# Patient Record
Sex: Male | Born: 1960 | Race: White | Hispanic: No | Marital: Married | Smoking: Former smoker
Health system: Southern US, Community
[De-identification: ages and names within clinical notes are randomized; demographics above are authoritative.]

## PROBLEM LIST (undated history)

## (undated) DIAGNOSIS — N200 Calculus of kidney: Secondary | ICD-10-CM

## (undated) HISTORY — DX: Calculus of kidney: N20.0

## (undated) HISTORY — PX: FOOT FRACTURE SURGERY: SHX645

## (undated) HISTORY — PX: HAND SURGERY: SHX662

---

## 2012-10-18 ENCOUNTER — Encounter: Payer: Self-pay | Admitting: Family Medicine

## 2012-10-18 ENCOUNTER — Telehealth: Payer: Self-pay | Admitting: Nurse Practitioner

## 2012-10-18 ENCOUNTER — Ambulatory Visit: Payer: Self-pay | Admitting: Family Medicine

## 2012-10-18 VITALS — BP 130/72 | HR 93 | Temp 98.1°F | Ht 68.5 in | Wt 214.8 lb

## 2012-10-18 DIAGNOSIS — J209 Acute bronchitis, unspecified: Secondary | ICD-10-CM

## 2012-10-18 DIAGNOSIS — R05 Cough: Secondary | ICD-10-CM

## 2012-10-18 DIAGNOSIS — J309 Allergic rhinitis, unspecified: Secondary | ICD-10-CM

## 2012-10-18 MED ORDER — HYDROCOD POLST-CHLORPHEN POLST 10-8 MG/5ML PO LQCR: 5.0000 mL | Freq: Two times a day (BID) | ORAL | Status: DC

## 2012-10-18 MED ORDER — AZITHROMYCIN 250 MG PO TABS: ORAL_TABLET | ORAL | Status: DC

## 2012-10-18 NOTE — Progress Notes (Signed)
  Subjective:    Patient ID: Jeremiah Barnett, male    DOB: 06-Oct-1960, 52 y.o.   MRN: [id number]  HPI Increased drainage for 6 days and now coughing for 3 days. The coughing has kept him awake at nighttime. He is been taking Alka-Seltzer plus.   Review of Systems  Constitutional: Positive for fever (low grade) and fatigue (due to not sleeping).  HENT: Positive for congestion and postnasal drip. Negative for sore throat and sneezing.   Eyes: Negative.   Respiratory: Positive for cough (productive, yellow). Negative for shortness of breath and wheezing.   Neurological: Negative for headaches.  Psychiatric/Behavioral: Positive for sleep disturbance (due to cough).       Objective:   Physical Exam        Assessment & Plan:

## 2012-10-18 NOTE — Patient Instructions (Signed)
Use Mucinex twice daily regularly Take antibiotic Use stronger cough medicine at night time if needed for severe cough  use cool mist humidifier in the bedroom at night Use Nasacort AQ over-the-counter one spray each nostril at bedtime Drink plenty fluids keep yourself well hydrated

## 2012-10-18 NOTE — Telephone Encounter (Signed)
APPT MADE

## 2014-05-21 ENCOUNTER — Telehealth: Payer: Self-pay | Admitting: Family Medicine

## 2014-05-22 NOTE — Telephone Encounter (Signed)
Appointment given for 4 with moore tomorrow

## 2014-05-23 ENCOUNTER — Encounter: Payer: Self-pay | Admitting: Family Medicine

## 2014-05-23 ENCOUNTER — Ambulatory Visit: Payer: Self-pay | Admitting: Family Medicine

## 2014-05-23 VITALS — BP 147/92 | HR 99 | Temp 98.4°F | Ht 68.5 in | Wt 216.0 lb

## 2014-05-23 DIAGNOSIS — K219 Gastro-esophageal reflux disease without esophagitis: Secondary | ICD-10-CM

## 2014-05-23 DIAGNOSIS — J209 Acute bronchitis, unspecified: Secondary | ICD-10-CM

## 2014-05-23 MED ORDER — AZITHROMYCIN 250 MG PO TABS: ORAL_TABLET | ORAL | Status: DC

## 2014-05-23 NOTE — Progress Notes (Signed)
   Subjective:    Patient ID: Tirth Cothron, male    DOB: 24-Feb-1961, 53 y.o.   MRN: [id number]  HPI Patient here today for cough, congestion, and fatigue that started about 3 weeks ago. He also complains of some heartburn occasionally at night.         There are no active problems to display for this patient.  Outpatient Encounter Prescriptions as of 05/23/2014  Medication Sig  . [DISCONTINUED] azithromycin (ZITHROMAX Z-PAK) 250 MG tablet Two pills day one , then 1 pill daily for 4 days  . [DISCONTINUED] chlorpheniramine-HYDROcodone (TUSSIONEX PENNKINETIC ER) 10-8 MG/5ML LQCR Take 5 mLs by mouth every 12 (twelve) hours.    Review of Systems  Constitutional: Positive for fatigue. Negative for fever.  HENT: Positive for congestion.   Eyes: Negative.   Respiratory: Positive for cough.   Cardiovascular: Negative.   Gastrointestinal: Negative.        Heartburn at night   Endocrine: Negative.   Genitourinary: Negative.   Musculoskeletal: Negative.   Skin: Negative.   Allergic/Immunologic: Negative.   Neurological: Negative.   Hematological: Negative.   Psychiatric/Behavioral: Negative.        Objective:   Physical Exam  Constitutional: He is oriented to person, place, and time. He appears well-developed and well-nourished. No distress.  HENT:  Head: Normocephalic and atraumatic.  Right Ear: External ear normal.  Left Ear: External ear normal.  Mouth/Throat: Oropharynx is clear and moist. No oropharyngeal exudate.  The throat is red posteriorly. Nasal congestion right greater than left  Eyes: Conjunctivae and EOM are normal. Pupils are equal, round, and reactive to light. Right eye exhibits no discharge. Left eye exhibits no discharge. No scleral icterus.  Neck: Normal range of motion. Neck supple. No thyromegaly present.  No anterior cervical nodes or carotid bruits  Cardiovascular: Normal rate, regular rhythm and normal heart sounds.   No murmur  heard. Pulmonary/Chest: Effort normal and breath sounds normal. No respiratory distress. He has no wheezes. He has no rales. He exhibits no tenderness.  The patient has a dry cough, there are no wheezes rhonchi or rales.  Abdominal: Soft. Bowel sounds are normal. He exhibits no mass. There is no tenderness. There is no rebound and no guarding.  Musculoskeletal: Normal range of motion.  Lymphadenopathy:    He has no cervical adenopathy.  Neurological: He is alert and oriented to person, place, and time.  Skin: Skin is warm and dry. No rash noted.  Psychiatric: He has a normal mood and affect. His behavior is normal. Judgment and thought content normal.  Nursing note and vitals reviewed.  BP 147/92 mmHg  Pulse 99  Temp(Src) 98.4 F (36.9 C) (Oral)  Ht 5' 8.5" (1.74 m)  Wt 216 lb (97.977 kg)  BMI 32.36 kg/m2        Assessment & Plan:  1. Acute bronchitis, unspecified organism  2. Gastroesophageal reflux disease, esophagitis presence not specified  Meds ordered this encounter  Medications  . azithromycin (ZITHROMAX) 250 MG tablet    Sig: As directed    Dispense:  6 tablet    Refill:  0   Patient Instructions  Take antibiotic as directed Take mucinex OTC- blue and white Pick up ZANTAC generic OTC 150 mg take 1 tab twice a day for heartburn. ([facility] brand is only $4 for 65 tabs) Watch sodium intake Drink beverages that are caffeine free      Arrie Senate MD

## 2014-05-23 NOTE — Patient Instructions (Addendum)
Take antibiotic as directed Take mucinex OTC- blue and white Pick up ZANTAC generic OTC 150 mg take 1 tab twice a day for heartburn. ([facility] brand is only $4 for 65 tabs) Watch sodium intake Drink beverages that are caffeine free

## 2014-06-04 ENCOUNTER — Telehealth: Payer: Self-pay | Admitting: Family Medicine

## 2014-06-04 MED ORDER — LEVOFLOXACIN 500 MG PO TABS: 500.0000 mg | ORAL_TABLET | Freq: Every day | ORAL | Status: DC

## 2014-06-04 NOTE — Telephone Encounter (Signed)
Continue Mucinex maximum strength 1 twice daily with a large glass of water Please call in a prescription for Levaquin 500 No. 7 one daily for infection until completed If patient is not better by the first of next week he should come back down for a CBC and a chest x-ray and let a provider listen to his chest again.

## 2014-06-04 NOTE — Telephone Encounter (Signed)
Pt aware.

## 2014-06-04 NOTE — Telephone Encounter (Signed)
Patient does not feel any better.  Coughs so hard in the am that he about throws up.  He does not have a fever but feels weak.  Please send in another antibiotic.

## 2015-02-26 ENCOUNTER — Ambulatory Visit (INDEPENDENT_AMBULATORY_CARE_PROVIDER_SITE_OTHER): Payer: Self-pay | Admitting: Family Medicine

## 2015-02-26 ENCOUNTER — Encounter: Payer: Self-pay | Admitting: Family Medicine

## 2015-02-26 ENCOUNTER — Encounter: Payer: Self-pay | Admitting: *Deleted

## 2015-02-26 VITALS — BP 133/87 | HR 80 | Temp 97.6°F | Ht 68.5 in | Wt 211.0 lb

## 2015-02-26 DIAGNOSIS — L219 Seborrheic dermatitis, unspecified: Secondary | ICD-10-CM

## 2015-02-26 MED ORDER — KETOCONAZOLE 2 % EX SHAM: 1.0000 "application " | MEDICATED_SHAMPOO | Freq: Every day | CUTANEOUS | Status: DC

## 2015-02-26 MED ORDER — PREDNISONE 10 MG PO TABS: ORAL_TABLET | ORAL | Status: DC

## 2015-02-26 NOTE — Patient Instructions (Signed)
Use shampoo as directed and take prednisone as directed Call us in 2 weeks regarding progress

## 2015-02-26 NOTE — Progress Notes (Signed)
Subjective:    Patient ID: Jeremiah Barnett, male    DOB: [birth year], 54 y.o.   MRN: [id number]  HPI Patient here today for knots under the skin in his head and neck. These places are sore and were first noticed last Wednesday.     There are no active problems to display for this patient.  Outpatient Encounter Prescriptions as of 02/26/2015  Medication Sig  . ranitidine (ZANTAC) 150 MG tablet Take 150 mg by mouth daily as needed for heartburn.  . [DISCONTINUED] azithromycin (ZITHROMAX) 250 MG tablet As directed  . [DISCONTINUED] levofloxacin (LEVAQUIN) 500 MG tablet Take 1 tablet (500 mg total) by mouth daily.   No facility-administered encounter medications on file as of 02/26/2015.      Review of Systems  Constitutional: Negative.   HENT: Negative.   Eyes: Negative.   Respiratory: Negative.   Cardiovascular: Negative.   Gastrointestinal: Negative.   Endocrine: Negative.   Genitourinary: Negative.   Musculoskeletal: Negative.   Skin: Negative.        Knots under skin - head and neck - right side  Allergic/Immunologic: Negative.   Neurological: Negative.   Hematological: Negative.   Psychiatric/Behavioral: Negative.        Objective:   Physical Exam  Constitutional: He is oriented to person, place, and time. He appears well-developed and well-nourished. No distress.  HENT:  Head: Normocephalic and atraumatic.  Right Ear: External ear normal.  Left Ear: External ear normal.  Nose: Nose normal.  Mouth/Throat: Oropharynx is clear and moist. No oropharyngeal exudate.  Eyes: Conjunctivae and EOM are normal. Pupils are equal, round, and reactive to light. Right eye exhibits no discharge. Left eye exhibits no discharge.  Neck: Normal range of motion. Neck supple. No thyromegaly present.  Cardiovascular: Normal rate, regular rhythm and normal heart sounds.   No murmur heard. Pulmonary/Chest: Effort normal and breath sounds normal. He has no wheezes. He has no rales.    Musculoskeletal: Normal range of motion.  Lymphadenopathy:    He has cervical adenopathy.  Neurological: He is alert and oriented to person, place, and time.  Skin: Skin is warm and dry. Rash noted. There is erythema. No pallor.  Patchy areas in tender areas of irritated skin with minimal scaling on scalp  Psychiatric: He has a normal mood and affect. His behavior is normal. Judgment and thought content normal.  Nursing note and vitals reviewed.   BP 133/87 mmHg  Pulse 80  Temp(Src) 97.6 F (36.4 C) (Oral)  Ht 5' 8.5" (1.74 m)  Wt 211 lb (95.709 kg)  BMI 31.61 kg/m2       Assessment & Plan:  1. Seborrheic dermatitis -Use shampoo as directed -Take and complete prednisone -Call us in a couple weeks regarding progress  Meds ordered this encounter  Medications  . ranitidine (ZANTAC) 150 MG tablet    Sig: Take 150 mg by mouth daily as needed for heartburn.  . predniSONE (DELTASONE) 10 MG tablet    Sig: TAPER- 1 tab by mouth four times daily for 2 days, 1 tab by mouth three times daily for 2 days, 1 tab by mouth twice a day for 2 days, then 1 tab by mouth daily for 2 days, then stop.    Dispense:  20 tablet    Refill:  0  . ketoconazole (NIZORAL) 2 % shampoo    Sig: Apply 1 application topically daily. For one week, then twice a week PRN thereafter.    Dispense:  120 mL  Refill:  1   Patient Instructions  Use shampoo as directed and take prednisone as directed Call us in 2 weeks regarding progress   Arrie Senate MD

## 2019-10-15 ENCOUNTER — Encounter: Payer: Self-pay | Admitting: *Deleted

## 2019-10-30 ENCOUNTER — Other Ambulatory Visit: Payer: Self-pay

## 2019-10-30 ENCOUNTER — Ambulatory Visit (INDEPENDENT_AMBULATORY_CARE_PROVIDER_SITE_OTHER): Payer: Self-pay | Admitting: Family Medicine

## 2019-10-30 ENCOUNTER — Encounter: Payer: Self-pay | Admitting: Family Medicine

## 2019-10-30 VITALS — BP 134/91 | HR 106 | Temp 98.1°F | Ht 68.0 in | Wt 218.0 lb

## 2019-10-30 DIAGNOSIS — K5900 Constipation, unspecified: Secondary | ICD-10-CM

## 2019-10-30 DIAGNOSIS — Z5989 Other problems related to housing and economic circumstances: Secondary | ICD-10-CM

## 2019-10-30 DIAGNOSIS — Z598 Other problems related to housing and economic circumstances: Secondary | ICD-10-CM

## 2019-10-30 NOTE — Patient Instructions (Signed)
Increase ClearLax to 2 capful per day - either 2 in the AM or 1 in the AM and 1 in the PM.  If this does not give you the effects you desire, add Senokot at bedtime and go back to 1 capful of ClearLax.   Constipation, Adult Constipation is when a person:  Poops (has a bowel movement) fewer times in a week than normal.  Has a hard time pooping.  Has poop that is dry, hard, or bigger than normal. Follow these instructions at home: Eating and drinking   Eat foods that have a lot of fiber, such as: ? Fresh fruits and vegetables. ? Whole grains. ? Beans.  Eat less of foods that are high in fat, low in fiber, or overly processed, such as: ? [place] fries. ? Hamburgers. ? Cookies. ? Candy. ? Soda.  Drink enough fluid to keep your pee (urine) clear or pale yellow. General instructions  Exercise regularly or as told by your doctor.  Go to the restroom when you feel like you need to poop. Do not hold it in.  Take over-the-counter and prescription medicines only as told by your doctor. These include any fiber supplements.  Do pelvic floor retraining exercises, such as: ? Doing deep breathing while relaxing your lower belly (abdomen). ? Relaxing your pelvic floor while pooping.  Watch your condition for any changes.  Keep all follow-up visits as told by your doctor. This is important. Contact a doctor if:  You have pain that gets worse.  You have a fever.  You have not pooped for 4 days.  You throw up (vomit).  You are not hungry.  You lose weight.  You are bleeding from the anus.  You have thin, pencil-like poop (stool). Get help right away if:  You have a fever, and your symptoms suddenly get worse.  You leak poop or have blood in your poop.  Your belly feels hard or bigger than normal (is bloated).  You have very bad belly pain.  You feel dizzy or you faint. This information is not intended to replace advice given to you by your health care provider.  Make sure you discuss any questions you have with your health care provider. Document Revised: 06/10/2017 Document Reviewed: 12/17/2015 Elsevier Patient Education  2020 [employer].

## 2019-10-30 NOTE — Progress Notes (Signed)
New Patient Office Visit  Assessment & Plan:  1. Constipation, unspecified constipation type - Encouraged him to make sure he is drinking plenty of water.  Education provided on constipation.  Advised to increase MiraLAX to 2 capfuls per day.  If this does not give him the desired effect I want to go back to 1 capful and add Senokot at bedtime.  2. Does not have health insurance - Patient declined health maintenance and lab work today as he does not have insurance.  He just started a new job at the [employer] 3 weeks ago and reports insurance through them.  I advised him to make an appointment for annual physical when he has insurance.   Follow-up: Return when you have insurance, for annual physical.   Hendricks Limes, MSN, APRN, FNP-C [facility]  Subjective:  Patient ID: Jeremiah Barnett, male    DOB: 04/16/1961  Age: 59 y.o. MRN: [id number]  Patient Care Team: Loman Brooklyn, FNP as PCP - General (Family Medicine)  CC:  Chief Complaint  Patient presents with  . New Patient (Initial Visit)    last seen here x 5 years ago  . Establish Care  . Constipation    x 2 months    HPI Jeremiah Barnett presents to establish care. He is not new to the office, but it has been > 3 years since his last visit.  Patient is concerned about constipation that has been going on for the past two months. Prior to constipation, he would have a BM 4x/week which was soft with no straining. He has not had a BM in the past 4 days. Continues to deny straining or pain when having BM. He has been taking Miralax 1 capful every morning in his coffee for the past 1.5 months now. He has been drinking a lot more water over the past three weeks.     Review of Systems  Constitutional: Negative for chills, fever, malaise/fatigue and weight loss.  HENT: Negative for congestion, ear discharge, ear pain, nosebleeds, sinus pain, sore throat and tinnitus.   Eyes: Negative for blurred vision,  double vision, pain, discharge and redness.  Respiratory: Negative for cough, shortness of breath and wheezing.   Cardiovascular: Negative for chest pain, palpitations and leg swelling.  Gastrointestinal: Positive for constipation. Negative for abdominal pain, diarrhea, heartburn, nausea and vomiting.  Genitourinary: Negative for dysuria, frequency and urgency.  Musculoskeletal: Negative for myalgias.  Skin: Negative for rash.  Neurological: Negative for dizziness, seizures, weakness and headaches.  Psychiatric/Behavioral: Negative for depression, substance abuse and suicidal ideas. The patient is not nervous/anxious.     Current Outpatient Medications:  .  famotidine (PEPCID) 10 MG tablet, Take 10 mg by mouth daily., Disp: , Rfl:  .  OVER THE COUNTER MEDICATION, laxative, Disp: , Rfl:  .  Phenylephrine-Aspirin (ALKA-SELTZER PLUS SINUS PO), Take by mouth., Disp: , Rfl:   Allergies  Allergen Reactions  . Aleve [Naproxen Sodium] Rash  . Ibuprofen Rash  . Penicillins Rash    Past Medical History:  Diagnosis Date  . Kidney stone     Past Surgical History:  Procedure Laterality Date  . FOOT FRACTURE SURGERY Right   . HAND SURGERY Right     Family History  Problem Relation Age of Onset  . Anxiety disorder Mother   . Ovarian cancer Mother   . Hernia Maternal Grandmother     Social History   Socioeconomic History  . Marital status: Married  Spouse name: Not on file  . Number of children: Not on file  . Years of education: Not on file  . Highest education level: Not on file  Occupational History  . Occupation: employed    Fish farm manager: [employer]  Tobacco Use  . Smoking status: Former Smoker    Types: Cigarettes    Start date: 07/12/1974    Quit date: 07/12/2010    Years since quitting: 9.3  . Smokeless tobacco: Never Used  Substance and Sexual Activity  . Alcohol use: Yes    Comment: occasionally  . Drug use: Never  . Sexual activity: Yes  Other Topics Concern   . Not on file  Social History Narrative  . Not on file   Social Determinants of Health   Financial Resource Strain:   . Difficulty of Paying Living Expenses:   Food Insecurity:   . Worried About Charity fundraiser in the Last Year:   . Arboriculturist in the Last Year:   Transportation Needs:   . Film/video editor (Medical):   Marland Kitchen Lack of Transportation (Non-Medical):   Physical Activity:   . Days of Exercise per Week:   . Minutes of Exercise per Session:   Stress:   . Feeling of Stress :   Social Connections:   . Frequency of Communication with Friends and Family:   . Frequency of Social Gatherings with Friends and Family:   . Attends Religious Services:   . Active Member of Clubs or Organizations:   . Attends Archivist Meetings:   Marland Kitchen Marital Status:   Intimate Partner Violence:   . Fear of Current or Ex-Partner:   . Emotionally Abused:   Marland Kitchen Physically Abused:   . Sexually Abused:     Objective:   Today's Vitals: BP (!) 134/91   Pulse (!) 106   Temp 98.1 F (36.7 C) (Temporal)   Ht 5\' 8"  (1.727 m)   Wt 218 lb (98.9 kg)   SpO2 97%   BMI 33.15 kg/m   Physical Exam Vitals reviewed.  Constitutional:      General: He is not in acute distress.    Appearance: Normal appearance. He is obese. He is not ill-appearing, toxic-appearing or diaphoretic.  HENT:     Head: Normocephalic and atraumatic.  Eyes:     General: No scleral icterus.       Right eye: No discharge.        Left eye: No discharge.     Conjunctiva/sclera: Conjunctivae normal.  Cardiovascular:     Rate and Rhythm: Normal rate and regular rhythm.     Heart sounds: Normal heart sounds. No murmur. No friction rub. No gallop.   Pulmonary:     Effort: Pulmonary effort is normal. No respiratory distress.     Breath sounds: Normal breath sounds. No stridor. No wheezing, rhonchi or rales.  Musculoskeletal:        General: Normal range of motion.     Cervical back: Normal range of motion.    Skin:    General: Skin is warm and dry.  Neurological:     Mental Status: He is alert and oriented to person, place, and time. Mental status is at baseline.  Psychiatric:        Mood and Affect: Mood normal.        Behavior: Behavior normal.        Thought Content: Thought content normal.        Judgment: Judgment normal.

## 2020-09-08 ENCOUNTER — Encounter: Payer: Self-pay | Admitting: Nurse Practitioner

## 2020-09-08 ENCOUNTER — Ambulatory Visit (INDEPENDENT_AMBULATORY_CARE_PROVIDER_SITE_OTHER): Payer: Self-pay | Admitting: Nurse Practitioner

## 2020-09-08 DIAGNOSIS — J029 Acute pharyngitis, unspecified: Secondary | ICD-10-CM | POA: Insufficient documentation

## 2020-09-08 DIAGNOSIS — J069 Acute upper respiratory infection, unspecified: Secondary | ICD-10-CM

## 2020-09-08 MED ORDER — PREDNISONE 10 MG (21) PO TBPK: ORAL_TABLET | ORAL | 0 refills | Status: DC

## 2020-09-08 MED ORDER — DM-GUAIFENESIN ER 30-600 MG PO TB12: 1.0000 | ORAL_TABLET | Freq: Two times a day (BID) | ORAL | 0 refills | Status: DC

## 2020-09-08 MED ORDER — AZITHROMYCIN 250 MG PO TABS: ORAL_TABLET | ORAL | 0 refills | Status: DC

## 2020-09-08 NOTE — Progress Notes (Signed)
   Virtual Visit via telephone Note Due to COVID-19 pandemic this visit was conducted virtually. This visit type was conducted due to national recommendations for restrictions regarding the COVID-19 Pandemic (e.g. social distancing, sheltering in place) in an effort to limit this patient's exposure and mitigate transmission in our community. All issues noted in this document were discussed and addressed.  A physical exam was not performed with this format.  I connected with Jeremiah Barnett on 09/08/20 2:45 PM home by telephone and verified that I am speaking with the correct person using two identifiers. Jeremiah Barnett is currently located at home during visit. The provider, Ivy Lynn, NP is located in their office at time of visit.  I discussed the limitations, risks, security and privacy concerns of performing an evaluation and management service by telephone and the availability of in person appointments. I also discussed with the patient that there may be a patient responsible charge related to this service. The patient expressed understanding and agreed to proceed.   History and Present Illness:  URI  This is a recurrent problem. The current episode started in the past 7 days. The problem has been gradually worsening. There has been no fever. Associated symptoms include congestion, coughing, headaches and sinus pain. Pertinent negatives include no abdominal pain or sore throat. He has tried acetaminophen, antihistamine and decongestant for the symptoms. The treatment provided mild relief.      Review of Systems  Constitutional: Positive for chills.  HENT: Positive for congestion and sinus pain. Negative for sore throat.   Respiratory: Positive for cough.   Cardiovascular: Negative.   Gastrointestinal: Negative for abdominal pain.  Genitourinary: Negative.   Musculoskeletal: Negative.   Neurological: Positive for headaches.  All other systems reviewed and are  negative.    Observations/Objective: Televisit-patient does not sound to be in distress.  Assessment and Plan: Upper respiratory infection with cough and congestion Upper respiratory infection with cough and congestion worsening in the last 7 days.  Patient was diagnosed positive with COVID-19.  Symptoms of fever, head cold, sinus pressure, joint ache, loss of appetite and cough have gradually worsened.  Patient has used only over-the-counter medication to treat symptoms with no therapeutic effect. Advised patient to increase fluid, Tylenol for pain, guaifenesin for cough and congestion, started patient on Zithromax, Advised patient to seek emergency care with oxygen saturation less than 91%.  Patient verbalized understanding. Rx sent to pharmacy.  Follow-up for worsening unresolved symptoms.  Follow Up Instructions: Follow-up for worsening unresolved symptoms.    I discussed the assessment and treatment plan with the patient. The patient was provided an opportunity to ask questions and all were answered. The patient agreed with the plan and demonstrated an understanding of the instructions.   The patient was advised to call back or seek an in-person evaluation if the symptoms worsen or if the condition fails to improve as anticipated.  The above assessment and management plan was discussed with the patient. The patient verbalized understanding of and has agreed to the management plan. Patient is aware to call the clinic if symptoms persist or worsen. Patient is aware when to return to the clinic for a follow-up visit. Patient educated on when it is appropriate to go to the emergency department.   Time call ended: 2:53 PM  I provided 8 minutes of non-face-to-face time during this encounter.    Ivy Lynn, NP

## 2020-09-08 NOTE — Assessment & Plan Note (Signed)
Upper respiratory infection with cough and congestion worsening in the last 7 days.  Patient was diagnosed positive with COVID-19.  Symptoms of fever, head cold, sinus pressure, joint ache, loss of appetite and cough have gradually worsened.  Patient has used only over-the-counter medication to treat symptoms with no therapeutic effect. Advised patient to increase fluid, Tylenol for pain, guaifenesin for cough and congestion, started patient on Zithromax, Advised patient to seek emergency care with oxygen saturation less than 91%.  Patient verbalized understanding. Rx sent to pharmacy.  Follow-up for worsening unresolved symptoms.

## 2020-09-08 NOTE — Patient Instructions (Signed)

## 2021-02-23 ENCOUNTER — Encounter: Payer: Self-pay | Admitting: Gastroenterology

## 2021-03-11 ENCOUNTER — Other Ambulatory Visit (INDEPENDENT_AMBULATORY_CARE_PROVIDER_SITE_OTHER): Payer: Self-pay

## 2021-03-11 ENCOUNTER — Ambulatory Visit (INDEPENDENT_AMBULATORY_CARE_PROVIDER_SITE_OTHER): Payer: Self-pay | Admitting: Gastroenterology

## 2021-03-11 ENCOUNTER — Encounter: Payer: Self-pay | Admitting: Gastroenterology

## 2021-03-11 VITALS — BP 150/80 | HR 100 | Ht 68.0 in | Wt 227.0 lb

## 2021-03-11 DIAGNOSIS — R7989 Other specified abnormal findings of blood chemistry: Secondary | ICD-10-CM

## 2021-03-11 DIAGNOSIS — R1011 Right upper quadrant pain: Secondary | ICD-10-CM

## 2021-03-11 DIAGNOSIS — K5900 Constipation, unspecified: Secondary | ICD-10-CM

## 2021-03-11 DIAGNOSIS — R932 Abnormal findings on diagnostic imaging of liver and biliary tract: Secondary | ICD-10-CM

## 2021-03-11 DIAGNOSIS — Z1211 Encounter for screening for malignant neoplasm of colon: Secondary | ICD-10-CM

## 2021-03-11 LAB — HEPATIC FUNCTION PANEL
ALT: 74 U/L — ABNORMAL HIGH (ref 0–53)
AST: 61 U/L — ABNORMAL HIGH (ref 0–37)
Albumin: 4 g/dL (ref 3.5–5.2)
Alkaline Phosphatase: 340 U/L — ABNORMAL HIGH (ref 39–117)
Bilirubin, Direct: 0.2 mg/dL (ref 0.0–0.3)
Total Bilirubin: 0.7 mg/dL (ref 0.2–1.2)
Total Protein: 8.3 g/dL (ref 6.0–8.3)

## 2021-03-11 MED ORDER — ONDANSETRON 4 MG PO TBDP: 4.0000 mg | ORAL_TABLET | Freq: Three times a day (TID) | ORAL | 1 refills | Status: DC | PRN

## 2021-03-11 MED ORDER — SUTAB 1479-225-188 MG PO TABS: 1.0000 | ORAL_TABLET | Freq: Once | ORAL | 0 refills | Status: AC

## 2021-03-11 MED ORDER — POLYETHYLENE GLYCOL 3350 17 G PO PACK: 17.0000 g | PACK | Freq: Every day | ORAL | 0 refills | Status: DC

## 2021-03-11 NOTE — Progress Notes (Signed)
HPI :  60 year old male with a history of renal stones, states he is otherwise healthy without medical problems but has not seen a physician in several years, history of alcohol use, referred here by Chevis Pretty, FNP for abnormal liver enzymes and abnormal liver imaging.  He states in general he has been in good health over the years however the past 6 to 7 months developed some pain in his right upper quadrant to right flank.  He states this has been intermittent and does not bother him all the time, but can be severe and wake him up from sleep at night.  Can last anywhere from 1 to 2 hours to few days at a time, rates it at 7 out of 10 when he gets it.  Eating can sometimes make it worse, sometimes it is simply just sporadic without clear precipitants.  He also has some nausea with this but no vomiting.  He eats well, appetite seems okay.  He has some baseline constipation that has been worse recently.  He has about 4 bowel movements per week at baseline, hard to get out stool and evacuate himself.  He has never had a prior colonoscopy.  No family history of colon cancer or liver disease.  He denies any weight loss.  He has reflux for which he takes Pepcid a few days per week.  He states it works well to control his symptoms when he takes it.  He denies any dysphagia.  No prior EGD.  No NSAID use.  He had COVID in February, states he has recovered mostly from that.  He states about 30 years ago he drank quite heavily and had about 12 beers per day for some time.  He states over the past several years, 20-30 years, however he is only had about 3 drinks per week and generally does not drink much alcohol.  He previously smoked cigarettes but quit about 11 years ago.  He denies any cardiopulmonary symptoms at baseline.  He was seen by his primary care and had some labs drawn in June.  His hemoglobin was 17.8 count.  Noted to have an alk phos of 381, AST 67, ALT 78.  Ultrasound showed heterogeneous  appearance with differential as outlined below, it was recommended that he have a follow-up CT scan or MRI which he has not had yet.  He has not had any additional follow-up liver serologies.  He states he has not had labs done for years no baseline LFTs on file.  Labs: 01/08/21: TSH 2.05 Lyme disease AB (-) Hgb 17.8, HCT 53.0, WBC 9.0, plt 299 AP 381, AST 67, ALT 78, T bil 0.7 BUN 20, Cr 0.97  [place] 01/20/21 - showed enlarged liver with heterogenous appearance? DDx includes "hepatitis, fatty liver, infiltrative malignancy" - recommended further evaluation with triple phase CT or MRI   Past Medical History:  Diagnosis Date   Kidney stone      Past Surgical History:  Procedure Laterality Date   FOOT FRACTURE SURGERY Right    HAND SURGERY Right    Family History  Problem Relation Age of Onset   Anxiety disorder Mother    Ovarian cancer Mother    Hernia Maternal Grandmother    Diabetes Maternal Aunt        x 2   Diabetes Cousin        "multiple"   Heart disease Maternal Uncle    Social History   Tobacco Use   Smoking status: Former  Types: Cigarettes    Start date: 07/12/1974    Quit date: 07/12/2010    Years since quitting: 10.6   Smokeless tobacco: Never  Vaping Use   Vaping Use: Never used  Substance Use Topics   Alcohol use: Yes    Comment: 3-4 per week-socially   Drug use: Never   Current Outpatient Medications  Medication Sig Dispense Refill   famotidine (PEPCID) 10 MG tablet Take 10 mg by mouth daily as needed.     ondansetron (ZOFRAN-ODT) 4 MG disintegrating tablet Take 1 tablet (4 mg total) by mouth every 8 (eight) hours as needed for nausea or vomiting. 30 tablet 1   OVER THE COUNTER MEDICATION as needed. laxative     polyethylene glycol (MIRALAX) 17 g packet Take 17 g by mouth daily. 14 each 0   Sodium Sulfate-Mag Sulfate-KCl (SUTAB) [phone] MG TABS Take 1 kit by mouth once for 1 dose. 24 tablet 0   No current facility-administered medications for this  visit.   Allergies  Allergen Reactions   Aleve [Naproxen Sodium] Rash   Ibuprofen Rash   Penicillins Rash     Review of Systems: All systems reviewed and negative except where noted in HPI.    Labs per HPI  Physical Exam: BP (!) 150/80   Pulse 100   Ht _0  (1.727 m)   Wt 227 lb (103 kg)   BMI 34.52 kg/m  Constitutional: Pleasant,well-developed, male in no acute distress. HEENT: Normocephalic and atraumatic. Conjunctivae are normal. No scleral icterus. Neck supple.  Cardiovascular: Normal rate, regular rhythm.  Pulmonary/chest: Effort normal and breath sounds normal. Abdominal: Soft, nondistended, nontender. Abdomen is protuberant.There are no masses palpable.  Extremities: no edema Lymphadenopathy: No cervical adenopathy noted. Neurological: Alert and oriented to person place and time. Skin: Skin is warm and dry. No rashes noted. Psychiatric: Normal mood and affect. Behavior is normal.   ASSESSMENT AND PLAN: 60 year old male here for new patient assessment of the following  Abnormal liver imaging Elevated liver enzymes Right upper quadrant pain to right flank pain / Nausea Constipation Colon cancer screening  As above patient with 6 to 7 months worth of intermittent right upper side pain associated with nausea as described above.  Initial labs with his primary care shows no anemia but he does have abnormal liver enzymes with an abnormal liver ultrasound.  I discussed differential diagnosis with him.  Unclear of the chronicity of his LFTs as he has not sought medical care routinely for some time and no baseline labs on file.  These were last checked in June I will check another set of LFTs today.  If they remain abnormal he will need full serologic work-up for evaluation of chronic liver disease.  He really does not take any medications or supplements to cause this.  Given the results of his ultrasound I will proceed with an MRI of the liver regardless to further  evaluate, he is agreeable to this.  Given his reflux I recommend he take his Pepcid daily and see if that helps settle his stomach at all, will also give some Zofran to use as needed for nausea.  He is not taking any NSAIDs.  In light of his liver enzymes I do recommend that he stop alcohol altogether until this is further sorted out.  We discussed his constipation at baseline recommend using MiraLAX daily and titrate up as needed.  He is never had a prior screening colonoscopy I recommend that we do that.  I discussed  risk benefits of colonoscopy and anesthesia with him and he wants to proceed, further recommendations pending that result.  Once I have his labs back and his MRI we will make further recommendations.  He agreed with the plan, all questions answered.  Plan: - MRI liver with contrast - LFTs today - if remain elevated and considered chronic will further workup with serologies - take pepcid daily - Zofran 64m ODT every 6 hrs PRN #30 RF1 - Miralax daily, titrate up as needed - colonoscopy - stop alcohol for now while workup ongoing.  SJolly Mango MD [facility]  CC: MHassell Done Mary-Margaret, *

## 2021-03-11 NOTE — Patient Instructions (Signed)
If you are age 60 or older, your body mass index should be between 23-30. Your Body mass index is 34.52 kg/m. If this is out of the aforementioned range listed, please consider follow up with your Primary Care Provider.  If you are age 89 or younger, your body mass index should be between 19-25. Your Body mass index is 34.52 kg/m. If this is out of the aformentioned range listed, please consider follow up with your Primary Care Provider.   __________________________________________________________  The [facility] providers would like to encourage you to use [facility] to communicate with providers for non-urgent requests or questions.  Due to long hold times on the telephone, sending your provider a message by [facility] may be a faster and more efficient way to get a response.  Please allow 48 business hours for a response.  Please remember that this is for non-urgent requests.   You have been scheduled for a colonoscopy. Please follow written instructions given to you at your visit today.  Please pick up your prep supplies at the pharmacy within the next 1-3 days. If you use inhalers (even only as needed), please bring them with you on the day of your procedure.   Please go to the lab in the basement of our building to have lab work done as you leave today. Hit "B" for basement when you get on the elevator.  When the doors open the lab is on your left.  We will call you with the results. Thank you.  Continue Pepcid - once daily  We have sent the following medications to your pharmacy for you to pick up at your convenience: Zofran 4 mg ODT: dissolve 1 tablet orally every 6 hours as needed  Please purchase the following medications over the counter and take as directed: Miralax: Take as directed once daily and adjust as needed   You will be contacted by [facility] in the next 2 days to arrange a MRI of LIVER.  The number on your caller ID will be [phone], please answer  when they call.  If you have not heard from them in 2 days please call [phone] to schedule.    Stop drinking alcohol.   Thank you for entrusting me with your care and for choosing [facility], Dr. Georgetown Cellar

## 2021-03-12 ENCOUNTER — Other Ambulatory Visit: Payer: Self-pay

## 2021-03-12 DIAGNOSIS — R1011 Right upper quadrant pain: Secondary | ICD-10-CM

## 2021-03-12 DIAGNOSIS — R7989 Other specified abnormal findings of blood chemistry: Secondary | ICD-10-CM

## 2021-03-12 DIAGNOSIS — R932 Abnormal findings on diagnostic imaging of liver and biliary tract: Secondary | ICD-10-CM

## 2021-03-24 ENCOUNTER — Ambulatory Visit (HOSPITAL_COMMUNITY)
Admission: RE | Admit: 2021-03-24 | Discharge: 2021-03-24 | Disposition: A | Discharge status: Home / Self Care | Payer: Self-pay | Source: Ambulatory Visit | Attending: Gastroenterology | Admitting: Gastroenterology

## 2021-03-24 ENCOUNTER — Other Ambulatory Visit: Payer: Self-pay

## 2021-03-24 ENCOUNTER — Other Ambulatory Visit (HOSPITAL_COMMUNITY): Payer: Self-pay | Admitting: Gastroenterology

## 2021-03-24 DIAGNOSIS — R7989 Other specified abnormal findings of blood chemistry: Secondary | ICD-10-CM

## 2021-03-24 DIAGNOSIS — R932 Abnormal findings on diagnostic imaging of liver and biliary tract: Secondary | ICD-10-CM | POA: Insufficient documentation

## 2021-03-24 DIAGNOSIS — R1011 Right upper quadrant pain: Secondary | ICD-10-CM

## 2021-03-24 DIAGNOSIS — M795 Residual foreign body in soft tissue: Secondary | ICD-10-CM

## 2021-03-24 MED ORDER — GADOBUTROL 1 MMOL/ML IV SOLN
10.0000 mL | Freq: Once | INTRAVENOUS | Status: AC | PRN
  Administered 2021-03-24: 10 mL via INTRAVENOUS

## 2021-03-26 LAB — HEPATITIS B SURFACE ANTIBODY,QUALITATIVE: Hep B S Ab: REACTIVE — AB

## 2021-03-26 LAB — ANA: Anti Nuclear Antibody (ANA): NEGATIVE

## 2021-03-26 LAB — ALPHA-1-ANTITRYPSIN: A-1 Antitrypsin, Ser: 186 mg/dL (ref 83–199)

## 2021-03-26 LAB — IRON,TIBC AND FERRITIN PANEL
%SAT: 28 % (calc) (ref 20–48)
Ferritin: 281 ng/mL (ref 24–380)
Iron: 89 ug/dL (ref 50–180)
TIBC: 318 mcg/dL (calc) (ref 250–425)

## 2021-03-26 LAB — HEPATITIS A ANTIBODY, TOTAL: Hepatitis A AB,Total: NONREACTIVE

## 2021-03-26 LAB — ANTI-SMOOTH MUSCLE ANTIBODY, IGG: Actin (Smooth Muscle) Antibody (IGG): <20 U (ref <20)

## 2021-03-26 LAB — HEPATITIS B SURFACE ANTIGEN: Hepatitis B Surface Ag: NONREACTIVE

## 2021-03-26 LAB — HEPATITIS C ANTIBODY
Hepatitis C Ab: NONREACTIVE
SIGNAL TO CUT-OFF: 0.03 (ref <1.00)

## 2021-03-26 LAB — IGG: IgG (Immunoglobin G), Serum: 1542 mg/dL (ref 600–1640)

## 2021-03-27 ENCOUNTER — Encounter: Payer: Self-pay | Admitting: Gastroenterology

## 2021-03-27 ENCOUNTER — Ambulatory Visit (AMBULATORY_SURGERY_CENTER): Payer: Self-pay | Admitting: Gastroenterology

## 2021-03-27 VITALS — BP 118/60 | HR 80 | Temp 99.1°F | Resp 16 | Ht 68.0 in | Wt 227.0 lb

## 2021-03-27 DIAGNOSIS — D129 Benign neoplasm of anus and anal canal: Secondary | ICD-10-CM

## 2021-03-27 DIAGNOSIS — D123 Benign neoplasm of transverse colon: Secondary | ICD-10-CM

## 2021-03-27 DIAGNOSIS — D120 Benign neoplasm of cecum: Secondary | ICD-10-CM

## 2021-03-27 DIAGNOSIS — Z1211 Encounter for screening for malignant neoplasm of colon: Secondary | ICD-10-CM

## 2021-03-27 DIAGNOSIS — K5900 Constipation, unspecified: Secondary | ICD-10-CM

## 2021-03-27 DIAGNOSIS — D128 Benign neoplasm of rectum: Secondary | ICD-10-CM

## 2021-03-27 MED ORDER — SODIUM CHLORIDE 0.9 % IV SOLN: 500.0000 mL | Freq: Once | INTRAVENOUS | Status: DC

## 2021-03-27 NOTE — Progress Notes (Signed)
VS taken by DT 

## 2021-03-27 NOTE — Progress Notes (Signed)
History and Physical Interval Note: Last seen 03/11/21. No interval changes in his status. Here for first time colon cancer screening. History of constipation. No FH of CRC. Denies cardiopulmonary symptoms. He wishes to proceed.   03/27/2021 4:31 PM  Jeremiah Barnett  has presented today for endoscopic procedure(s), with the diagnosis of  Encounter Diagnoses  Name Primary?   Special screening for malignant neoplasms, colon Yes   Constipation, unspecified constipation type   .  The various methods of evaluation and treatment have been discussed with the patient and/or family. After consideration of risks, benefits and other options for treatment, the patient has consented to  the endoscopic procedure(s).   The patient's history has been reviewed, patient examined, no change in status, stable for surgery.  I have reviewed the patient's chart and labs.  Questions were answered to the patient's satisfaction.    Jolly Mango, MD [facility]

## 2021-03-27 NOTE — Progress Notes (Signed)
Called to room to assist during endoscopic procedure.  Patient ID and intended procedure confirmed with present staff. Received instructions for my participation in the procedure from the performing physician.  

## 2021-03-27 NOTE — Patient Instructions (Signed)
3 polyps removed- await pathology  Please read over handouts about polyps, diverticulosis and hemorrhoids Continue your normal medications  YOU HAD AN ENDOSCOPIC PROCEDURE TODAY AT [place]:   Refer to the procedure report that was given to you for any specific questions about what was found during the examination.  If the procedure report does not answer your questions, please call your gastroenterologist to clarify.  If you requested that your care partner not be given the details of your procedure findings, then the procedure report has been included in a sealed envelope for you to review at your convenience later.  YOU SHOULD EXPECT: Some feelings of bloating in the abdomen. Passage of more gas than usual.  Walking can help get rid of the air that was put into your GI tract during the procedure and reduce the bloating. If you had a lower endoscopy (such as a colonoscopy or flexible sigmoidoscopy) you may notice spotting of blood in your stool or on the toilet paper. If you underwent a bowel prep for your procedure, you may not have a normal bowel movement for a few days.  Please Note:  You might notice some irritation and congestion in your nose or some drainage.  This is from the oxygen used during your procedure.  There is no need for concern and it should clear up in a day or so.  SYMPTOMS TO REPORT IMMEDIATELY:  Following lower endoscopy (colonoscopy or flexible sigmoidoscopy):  Excessive amounts of blood in the stool  Significant tenderness or worsening of abdominal pains  Swelling of the abdomen that is new, acute  Fever of 100F or higher  For urgent or emergent issues, a gastroenterologist can be reached at any hour by calling [phone]. Do not use MyChart messaging for urgent concerns.    DIET:  We do recommend a small meal at first, but then you may proceed to your regular diet.  Drink plenty of fluids but you should avoid alcoholic beverages for 24  hours.  ACTIVITY:  You should plan to take it easy for the rest of today and you should NOT DRIVE or use heavy machinery until tomorrow (because of the sedation medicines used during the test).    FOLLOW UP: Our staff will call the number listed on your records 48-72 hours following your procedure to check on you and address any questions or concerns that you may have regarding the information given to you following your procedure. If we do not reach you, we will leave a message.  We will attempt to reach you two times.  During this call, we will ask if you have developed any symptoms of COVID 19. If you develop any symptoms (ie: fever, flu-like symptoms, shortness of breath, cough etc.) before then, please call [phone].  If you test positive for Covid 19 in the 2 weeks post procedure, please call and report this information to [place].    If any biopsies were taken you will be contacted by phone or by letter within the next 1-3 weeks.  Please call us at [phone] if you have not heard about the biopsies in 3 weeks.    SIGNATURES/CONFIDENTIALITY: You and/or your care partner have signed paperwork which will be entered into your electronic medical record.  These signatures attest to the fact that that the information above on your After Visit Summary has been reviewed and is understood.  Full responsibility of the confidentiality of this discharge information lies with you and/or your care-partner.

## 2021-03-27 NOTE — Progress Notes (Signed)
Data will not transfer from monitor, vs being posted manually.   

## 2021-03-27 NOTE — Progress Notes (Signed)
Report given to PACU, vss 

## 2021-03-27 NOTE — Op Note (Signed)
Jeremiah Barnett Patient Name: Jeremiah Barnett Procedure Date: 03/27/2021 4:26 PM MRN: [id number] Endoscopist: Remo Lipps P. Havery [name] , MD Age: 60 Referring MD:  Date of Birth: 1961-01-09 Gender: Male Account #: [id number] Procedure:                Colonoscopy Indications:              Screening for colorectal malignant neoplasm, This                            is the patient's first colonoscopy Medicines:                Monitored Anesthesia Care Procedure:                Pre-Anesthesia Assessment:                           - Prior to the procedure, a History and Physical                            was performed, and patient medications and                            allergies were reviewed. The patient's tolerance of                            previous anesthesia was also reviewed. The risks                            and benefits of the procedure and the sedation                            options and risks were discussed with the patient.                            All questions were answered, and informed consent                            was obtained. Prior Anticoagulants: The patient has                            taken no previous anticoagulant or antiplatelet                            agents. ASA Grade Assessment: I - A normal, healthy                            patient. After reviewing the risks and benefits,                            the patient was deemed in satisfactory condition to                            undergo the procedure.  After obtaining informed consent, the colonoscope                            was passed under direct vision. Throughout the                            procedure, the patient's blood pressure, pulse, and                            oxygen saturations were monitored continuously. The                            Colonoscope was introduced through the anus and                            advanced to the the cecum,  identified by                            appendiceal orifice and ileocecal valve. The                            colonoscopy was performed without difficulty. The                            patient tolerated the procedure well. The quality                            of the bowel preparation was good. The ileocecal                            valve, appendiceal orifice, and rectum were                            photographed. Scope In: 4:34:00 PM Scope Out: 4:53:16 PM Scope Withdrawal Time: 0 hours 17 minutes 0 seconds  Total Procedure Duration: 0 hours 19 minutes 16 seconds  Findings:                 The perianal and digital rectal examinations were                            normal.                           A diminutive polyp was found in the ileocecal                            valve. The polyp was sessile. The polyp was removed                            with a cold biopsy forceps. Resection and retrieval                            were complete.  A 3 mm polyp was found in the transverse colon. The                            polyp was sessile. The polyp was removed with a                            cold biopsy forceps. Resection and retrieval were                            complete.                           A diminutive polyp was found in the rectum. The                            polyp was sessile. The polyp was removed with a                            cold snare. Resection and retrieval were complete.                           Multiple small-mouthed diverticula were found in                            the sigmoid colon.                           Internal hemorrhoids were found during                            retroflexion. The hemorrhoids were moderate.                           The exam was otherwise without abnormality. Complications:            No immediate complications. Estimated blood loss:                            Minimal. Estimated Blood  Loss:     Estimated blood loss was minimal. Impression:               - One diminutive polyp at the ileocecal valve,                            removed with a cold biopsy forceps. Resected and                            retrieved.                           - One 3 mm polyp in the transverse colon, removed                            with a cold biopsy forceps. Resected and retrieved.                           -  One diminutive polyp in the rectum, removed with                            a cold snare. Resected and retrieved.                           - Diverticulosis in the sigmoid colon.                           - Internal hemorrhoids.                           - The examination was otherwise normal. Recommendation:           - Patient has a contact number available for                            emergencies. The signs and symptoms of potential                            delayed complications were discussed with the                            patient. Return to normal activities tomorrow.                            Written discharge instructions were provided to the                            patient.                           - Resume previous diet.                           - Continue present medications.                           - Await pathology results. Remo Lipps P. Kaidence Sant, MD 03/27/2021 4:57:37 PM This report has been signed electronically.

## 2021-03-30 ENCOUNTER — Other Ambulatory Visit: Payer: Self-pay

## 2021-03-30 DIAGNOSIS — R932 Abnormal findings on diagnostic imaging of liver and biliary tract: Secondary | ICD-10-CM

## 2021-03-30 DIAGNOSIS — Z23 Encounter for immunization: Secondary | ICD-10-CM

## 2021-03-30 DIAGNOSIS — R7989 Other specified abnormal findings of blood chemistry: Secondary | ICD-10-CM

## 2021-03-30 NOTE — Addendum Note (Signed)
Addended by: Yevette Edwards on: 03/30/2021 11:20 AM   Modules accepted: Orders

## 2021-03-31 ENCOUNTER — Telehealth: Payer: Self-pay | Admitting: *Deleted

## 2021-03-31 NOTE — Telephone Encounter (Signed)
  Follow up Call-  Call back number 03/27/2021  Post procedure Call Back phone  # [phone]  Permission to leave phone message Yes  Some recent data might be hidden     Patient questions:  Do you have a fever, pain , or abdominal swelling? No. Pain Score  0 *  Have you tolerated food without any problems? Yes.    Have you been able to return to your normal activities? Yes.    Do you have any questions about your discharge instructions: Diet   No. Medications  No. Follow up visit  No.  Do you have questions or concerns about your Care? No.  Actions: * If pain score is 4 or above: No action needed, pain <4.  Have you developed a fever since your procedure? no  2.   Have you had an respiratory symptoms (SOB or cough) since your procedure? no  3.   Have you tested positive for COVID 19 since your procedure no  4.   Have you had any family members/close contacts diagnosed with the COVID 19 since your procedure?  no   If yes to any of these questions please route to Joylene John, RN and Joella Prince, RN

## 2021-04-01 ENCOUNTER — Encounter: Payer: Self-pay | Admitting: Gastroenterology

## 2021-04-08 ENCOUNTER — Ambulatory Visit (INDEPENDENT_AMBULATORY_CARE_PROVIDER_SITE_OTHER): Payer: Self-pay | Admitting: Gastroenterology

## 2021-04-08 DIAGNOSIS — R932 Abnormal findings on diagnostic imaging of liver and biliary tract: Secondary | ICD-10-CM

## 2021-04-08 DIAGNOSIS — Z23 Encounter for immunization: Secondary | ICD-10-CM

## 2021-04-08 DIAGNOSIS — R7989 Other specified abnormal findings of blood chemistry: Secondary | ICD-10-CM

## 2021-04-15 ENCOUNTER — Telehealth: Payer: Self-pay

## 2021-04-15 NOTE — Telephone Encounter (Signed)
Spoke with patient to remind his that he is due for repeat labs at this time. No appointment is necessary. Patient is aware that he can stop by the lab in the basement at his convenience between 7:30 AM - 5 PM, Monday through Friday. Patient verbalized understanding and had no concerns at the end of the call.

## 2021-04-15 NOTE — Telephone Encounter (Signed)
-----   Message from Yevette Edwards, RN sent at 03/30/2021 11:12 AM EDT ----- Regarding: Labs Hepatic function panel, order in epic

## 2021-04-17 ENCOUNTER — Other Ambulatory Visit: Payer: Self-pay

## 2021-04-17 DIAGNOSIS — R7989 Other specified abnormal findings of blood chemistry: Secondary | ICD-10-CM

## 2021-04-17 DIAGNOSIS — R932 Abnormal findings on diagnostic imaging of liver and biliary tract: Secondary | ICD-10-CM

## 2021-04-17 LAB — HEPATIC FUNCTION PANEL
ALT: 68 U/L — ABNORMAL HIGH (ref 0–53)
AST: 55 U/L — ABNORMAL HIGH (ref 0–37)
Albumin: 4.2 g/dL (ref 3.5–5.2)
Alkaline Phosphatase: 338 U/L — ABNORMAL HIGH (ref 39–117)
Bilirubin, Direct: 0.2 mg/dL (ref 0.0–0.3)
Total Bilirubin: 0.6 mg/dL (ref 0.2–1.2)
Total Protein: 8 g/dL (ref 6.0–8.3)

## 2021-04-20 ENCOUNTER — Other Ambulatory Visit: Payer: Self-pay

## 2021-04-20 DIAGNOSIS — R7989 Other specified abnormal findings of blood chemistry: Secondary | ICD-10-CM

## 2021-04-20 DIAGNOSIS — R1011 Right upper quadrant pain: Secondary | ICD-10-CM

## 2021-04-20 DIAGNOSIS — R932 Abnormal findings on diagnostic imaging of liver and biliary tract: Secondary | ICD-10-CM

## 2021-04-24 ENCOUNTER — Other Ambulatory Visit (HOSPITAL_COMMUNITY): Payer: Self-pay | Admitting: Physician Assistant

## 2021-04-27 ENCOUNTER — Encounter (HOSPITAL_COMMUNITY): Payer: Self-pay

## 2021-04-27 ENCOUNTER — Ambulatory Visit (HOSPITAL_COMMUNITY)
Admission: RE | Admit: 2021-04-27 | Discharge: 2021-04-27 | Disposition: A | Discharge status: Home / Self Care | Payer: Self-pay | Source: Ambulatory Visit | Attending: Gastroenterology | Admitting: Gastroenterology

## 2021-04-27 ENCOUNTER — Other Ambulatory Visit: Payer: Self-pay

## 2021-04-27 DIAGNOSIS — Z87891 Personal history of nicotine dependence: Secondary | ICD-10-CM | POA: Insufficient documentation

## 2021-04-27 DIAGNOSIS — Z880 Allergy status to penicillin: Secondary | ICD-10-CM | POA: Insufficient documentation

## 2021-04-27 DIAGNOSIS — R1011 Right upper quadrant pain: Secondary | ICD-10-CM | POA: Insufficient documentation

## 2021-04-27 DIAGNOSIS — Z886 Allergy status to analgesic agent status: Secondary | ICD-10-CM | POA: Insufficient documentation

## 2021-04-27 DIAGNOSIS — K7400 Hepatic fibrosis, unspecified: Secondary | ICD-10-CM | POA: Insufficient documentation

## 2021-04-27 DIAGNOSIS — R932 Abnormal findings on diagnostic imaging of liver and biliary tract: Secondary | ICD-10-CM | POA: Insufficient documentation

## 2021-04-27 DIAGNOSIS — R7989 Other specified abnormal findings of blood chemistry: Secondary | ICD-10-CM | POA: Insufficient documentation

## 2021-04-27 LAB — CBC
HCT: 48.2 % (ref 39.0–52.0)
Hemoglobin: 16.5 g/dL (ref 13.0–17.0)
MCH: 31.1 pg (ref 26.0–34.0)
MCHC: 34.2 g/dL (ref 30.0–36.0)
MCV: 90.9 fL (ref 80.0–100.0)
Platelets: 226 10*3/uL (ref 150–400)
RBC: 5.3 MIL/uL (ref 4.22–5.81)
RDW: 11.9 % (ref 11.5–15.5)
WBC: 10.6 10*3/uL — ABNORMAL HIGH (ref 4.0–10.5)
nRBC: 0 % (ref 0.0–0.2)

## 2021-04-27 LAB — PROTIME-INR
INR: 1 (ref 0.8–1.2)
Prothrombin Time: 12.9 seconds (ref 11.4–15.2)

## 2021-04-27 MED ORDER — LIDOCAINE HCL (PF) 1 % IJ SOLN
INTRAMUSCULAR | Status: AC
  Filled 2021-04-27: qty 30

## 2021-04-27 MED ORDER — GELATIN ABSORBABLE 12-7 MM EX MISC
CUTANEOUS | Status: AC
  Filled 2021-04-27: qty 1

## 2021-04-27 MED ORDER — MIDAZOLAM HCL 2 MG/2ML IJ SOLN
INTRAMUSCULAR | Status: DC | PRN
Start: 2021-04-27 — End: 2021-04-28
  Administered 2021-04-27 (×2): 1 mg via INTRAVENOUS

## 2021-04-27 MED ORDER — MIDAZOLAM HCL 2 MG/2ML IJ SOLN
INTRAMUSCULAR | Status: AC
  Filled 2021-04-27: qty 2

## 2021-04-27 MED ORDER — SODIUM CHLORIDE 0.9 % IV SOLN: INTRAVENOUS | Status: DC

## 2021-04-27 MED ORDER — FENTANYL CITRATE (PF) 100 MCG/2ML IJ SOLN
INTRAMUSCULAR | Status: DC | PRN
  Administered 2021-04-27: 50 ug via INTRAVENOUS

## 2021-04-27 MED ORDER — FENTANYL CITRATE (PF) 100 MCG/2ML IJ SOLN
INTRAMUSCULAR | Status: AC
  Filled 2021-04-27: qty 2

## 2021-04-27 NOTE — H&P (Signed)
Chief Complaint: Patient was seen in consultation today for liver core biopsy at the request of [place]  Referring Physician(s): [place]  Supervising Physician: Corrie Mckusick  Patient Status: [facility] - Out-pt  History of Present Illness: Jeremiah Barnett is a 60 y.o. male  Noted increased LFTs at Primary MD Referred to GI RUQ pain x 6 mo Previous alcohol usage-- very little now for 20 yrs  [place] 01/20/21 - showed enlarged liver with heterogenous appearance? DDx includes "hepatitis, fatty liver, infiltrative malignancy" MRI 9/13: IMPRESSION: 1. No MR findings to explain right upper quadrant abdominal pain or elevated LFTs. No biliary ductal dilatation. 2. Enlarged portacaval lymph nodes, nonspecific.  Scheduled for liver core biopsy per Dr Havery [name]    Past Medical History:  Diagnosis Date   Kidney stone     Past Surgical History:  Procedure Laterality Date   FOOT FRACTURE SURGERY Right    HAND SURGERY Right     Allergies: Aleve [naproxen sodium], Ibuprofen, and Penicillins  Medications: Prior to Admission medications   Medication Sig Start Date End Date Taking? Authorizing Provider  famotidine (PEPCID) 10 MG tablet Take 10 mg by mouth daily as needed for heartburn or indigestion.   Yes [provider]  ondansetron (ZOFRAN-ODT) 4 MG disintegrating tablet Take 1 tablet (4 mg total) by mouth every 8 (eight) hours as needed for nausea or vomiting. 03/11/21  Yes Armbruster, Carlota Raspberry, MD  polyethylene glycol (MIRALAX) 17 g packet Take 17 g by mouth daily. 03/11/21  Yes Armbruster, Carlota Raspberry, MD     Family History  Problem Relation Age of Onset   Anxiety disorder Mother    Ovarian cancer Mother    Diabetes Maternal Aunt        x 2   Heart disease Maternal Uncle    Hernia Maternal Grandmother    Diabetes Cousin        "multiple"   Stomach cancer Neg Hx    Rectal cancer Neg Hx    Esophageal cancer Neg Hx    Colitis Neg Hx      Social History   Socioeconomic History   Marital status: Married    Spouse name: Not on file   Number of children: Not on file   Years of education: Not on file   Highest education level: Not on file  Occupational History   Occupation: employed    Employer: [employer]  Tobacco Use   Smoking status: Former    Types: Cigarettes    Start date: 07/12/1974    Quit date: 07/12/2010    Years since quitting: 10.8   Smokeless tobacco: Never  Vaping Use   Vaping Use: Never used  Substance and Sexual Activity   Alcohol use: Yes    Comment: 3-4 per week-socially   Drug use: Yes    Types: Marijuana    Comment: last was a week ago, a "couple of hits off a bowl"   Sexual activity: Yes  Other Topics Concern   Not on file  Social History Narrative   Not on file   Social Determinants of Health   Financial Resource Strain: Not on file  Food Insecurity: Not on file  Transportation Needs: Not on file  Physical Activity: Not on file  Stress: Not on file  Social Connections: Not on file    Review of Systems: A 12 point ROS discussed and pertinent positives are indicated in the HPI above.  All other systems are negative.  Review of Systems  Constitutional:  Negative for activity change, fatigue and fever.  HENT:  Negative for trouble swallowing.   Respiratory:  Negative for cough and shortness of breath.   Cardiovascular:  Negative for chest pain.  Gastrointestinal:  Positive for abdominal pain.  Musculoskeletal:  Negative for back pain.  Skin:  Negative for color change.  Psychiatric/Behavioral:  Negative for behavioral problems and confusion.    Vital Signs: BP (!) 155/84   Pulse 90   Temp 98.3 F (36.8 C) (Oral)   Resp 15   Ht 5\' 11"  (1.803 m)   Wt 220 lb (99.8 kg)   SpO2 97%   BMI 30.68 kg/m   Physical Exam Vitals reviewed.  HENT:     Mouth/Throat:     Mouth: Mucous membranes are moist.  Cardiovascular:     Rate and Rhythm: Normal rate and regular rhythm.      Heart sounds: Normal heart sounds.  Pulmonary:     Effort: Pulmonary effort is normal.     Breath sounds: Normal breath sounds.  Abdominal:     Palpations: Abdomen is soft.     Tenderness: There is abdominal tenderness.     Comments: Minimal tenderness- Rt lateral abd  Musculoskeletal:        General: Normal range of motion.     Right lower leg: No edema.     Left lower leg: No edema.  Skin:    General: Skin is warm.  Neurological:     Mental Status: He is oriented to person, place, and time.  Psychiatric:        Behavior: Behavior normal.    Imaging: No results found.  Labs:  CBC: No results for input(s): WBC, HGB, HCT, PLT in the last 8760 hours.  COAGS: No results for input(s): INR, APTT in the last 8760 hours.  BMP: No results for input(s): NA, K, CL, CO2, GLUCOSE, BUN, CALCIUM, CREATININE, GFRNONAA, GFRAA in the last 8760 hours.  Invalid input(s): CMP  LIVER FUNCTION TESTS: Recent Labs    03/11/21 1512 04/17/21 1613  BILITOT 0.7 0.6  AST 61* 55*  ALT 74* 68*  ALKPHOS 340* 338*  PROT 8.3 8.0  ALBUMIN 4.0 4.2    TUMOR MARKERS: No results for input(s): AFPTM, CEA, CA199, CHROMGRNA in the last 8760 hours.  Assessment and Plan:  Elevated liver functions RUQ pain x 6 mo Fatty liver by [place] Scheduled for liver core biopsy Risks and benefits of liver core biopsy was discussed with the patient and/or patient's family including, but not limited to bleeding, infection, damage to adjacent structures or low yield requiring additional tests.  All of the questions were answered and there is agreement to proceed. Consent signed and in chart.    Thank you for this interesting consult.  I greatly enjoyed meeting Clem Wisenbaker and look forward to participating in their care.  A copy of this report was sent to the requesting provider on this date.  Electronically Signed: Lavonia Drafts, PA-C 04/27/2021, 12:24 PM   I spent a total of  30 Minutes   in face  to face in clinical consultation, greater than 50% of which was counseling/coordinating care for liver core biopsy

## 2021-04-27 NOTE — Procedures (Signed)
Interventional Radiology Procedure Note  Procedure: US guided biopsy of liver biopsy, medical liver Complications: None EBL: None Recommendations: - Bedrest 2 hours.   - Routine wound care - Follow up pathology - Advance diet   Signed,  Shristi Scheib, DO     

## 2021-04-29 LAB — SURGICAL PATHOLOGY

## 2021-05-04 ENCOUNTER — Other Ambulatory Visit: Payer: Self-pay

## 2021-05-04 DIAGNOSIS — R1011 Right upper quadrant pain: Secondary | ICD-10-CM

## 2021-05-04 DIAGNOSIS — R932 Abnormal findings on diagnostic imaging of liver and biliary tract: Secondary | ICD-10-CM

## 2021-05-04 DIAGNOSIS — R7989 Other specified abnormal findings of blood chemistry: Secondary | ICD-10-CM

## 2021-05-13 ENCOUNTER — Other Ambulatory Visit: Payer: Self-pay | Admitting: Gastroenterology

## 2021-05-13 ENCOUNTER — Ambulatory Visit (HOSPITAL_COMMUNITY)
Admission: RE | Admit: 2021-05-13 | Discharge: 2021-05-13 | Disposition: A | Discharge status: Home / Self Care | Payer: Self-pay | Source: Ambulatory Visit | Attending: Gastroenterology | Admitting: Gastroenterology

## 2021-05-13 ENCOUNTER — Other Ambulatory Visit (INDEPENDENT_AMBULATORY_CARE_PROVIDER_SITE_OTHER): Payer: Self-pay

## 2021-05-13 DIAGNOSIS — R1011 Right upper quadrant pain: Secondary | ICD-10-CM

## 2021-05-13 DIAGNOSIS — R932 Abnormal findings on diagnostic imaging of liver and biliary tract: Secondary | ICD-10-CM

## 2021-05-13 DIAGNOSIS — R7989 Other specified abnormal findings of blood chemistry: Secondary | ICD-10-CM

## 2021-05-13 LAB — HEPATIC FUNCTION PANEL
ALT: 58 U/L — ABNORMAL HIGH (ref 0–53)
AST: 43 U/L — ABNORMAL HIGH (ref 0–37)
Albumin: 4.1 g/dL (ref 3.5–5.2)
Alkaline Phosphatase: 331 U/L — ABNORMAL HIGH (ref 39–117)
Bilirubin, Direct: 0.2 mg/dL (ref 0.0–0.3)
Total Bilirubin: 0.8 mg/dL (ref 0.2–1.2)
Total Protein: 8.2 g/dL (ref 6.0–8.3)

## 2021-05-13 LAB — GAMMA GT: GGT: 576 U/L — ABNORMAL HIGH (ref 7–51)

## 2021-05-13 MED ORDER — GADOBUTROL 1 MMOL/ML IV SOLN
10.0000 mL | Freq: Once | INTRAVENOUS | Status: AC | PRN
  Administered 2021-05-13: 10 mL via INTRAVENOUS

## 2021-05-18 ENCOUNTER — Telehealth: Payer: Self-pay

## 2021-05-18 LAB — MITOCHONDRIAL ANTIBODIES: Mitochondrial M2 Ab, IgG: <=20 U (ref <=20.0)

## 2021-05-19 NOTE — Telephone Encounter (Signed)
error 

## 2021-05-21 ENCOUNTER — Telehealth: Payer: Self-pay

## 2021-05-21 NOTE — Telephone Encounter (Signed)
Fax received from [place]. Patient has an appointment on 06-16-21 at 9:30am

## 2021-05-21 NOTE — Telephone Encounter (Signed)
Noted, thanks!

## 2021-06-17 ENCOUNTER — Telehealth: Payer: Self-pay | Admitting: Gastroenterology

## 2021-06-17 NOTE — Telephone Encounter (Signed)
Inbound call from patient wife. States patient referred to  Dr. Zollie Scale at [facility] for [facility]. States he need a copy of the ultrasound faxed as he couldn't access it. Fax number is [phone]

## 2021-06-17 NOTE — Telephone Encounter (Signed)
Called and spoke with patient's wife, she states that patient saw Dr. Zollie Scale yesterday and he could not see the [place] results. Advised that they should be available in epic for his review. Advised that I will fax all of the imaging results to the fax number that she provided. Pt's wife had no other concerns at the end of the call.   Imaging results faxed to Dr. Zollie Scale via epic.

## 2021-08-31 ENCOUNTER — Ambulatory Visit (INDEPENDENT_AMBULATORY_CARE_PROVIDER_SITE_OTHER): Payer: Commercial Managed Care - HMO | Admitting: Nurse Practitioner

## 2021-08-31 ENCOUNTER — Ambulatory Visit (INDEPENDENT_AMBULATORY_CARE_PROVIDER_SITE_OTHER): Payer: Commercial Managed Care - HMO

## 2021-08-31 ENCOUNTER — Encounter: Payer: Self-pay | Admitting: Nurse Practitioner

## 2021-08-31 VITALS — BP 145/84 | HR 100 | Temp 98.2°F | Resp 20 | Ht 71.0 in | Wt 230.0 lb

## 2021-08-31 DIAGNOSIS — M542 Cervicalgia: Secondary | ICD-10-CM

## 2021-08-31 MED ORDER — PREDNISONE 10 MG (21) PO TBPK: ORAL_TABLET | ORAL | 0 refills | Status: DC

## 2021-08-31 NOTE — Patient Instructions (Signed)
Cervical Sprain A cervical sprain is also called a neck sprain. It is a stretch or tear in one or more ligaments in the neck. Ligaments are tissues that connect bones to each other. Neck sprains can be mild, bad, or very bad. A very bad sprain in the neck can cause the bones in the neck to be unstable. This can damage the spinal cord. It can also cause serious problems in the brain, spinal cord, and nerves (nervous system). Most neck sprains heal in 4-6 weeks. It can take more or less time depending on: What caused the injury. The amount of injury. What are the causes? Neck sprains may be caused by trauma, such as: An injury from an accident in a vehicle such as a car or boat. A fall. The head and neck being moved front to back or side to side all of a sudden (whiplash injury). Mild neck sprains may be caused by wear and tear over time. What increases the risk? The following factors may make you more likely to develop this condition: Taking part in activities that put you at high risk of hurting your neck. These include: Contact sports. Animator. Gymnastics. Diving. Taking risks when driving or riding in a vehicle such as a car or boat. Arthritis caused by wear and tear of the joints in the spine. The neck not being very strong or flexible. Having had a neck injury in the past. Poor posture. Spending a lot of time in certain positions that put stress on the neck. This may be from sitting at a computer for a long time. What are the signs or symptoms? Symptoms of this condition include: Your neck, shoulders, or upper back feeling: Painful or sore. Stiff. Tender. Swollen. Hot, or like it is burning. Sudden tightening of neck muscles (spasms). Not being able to move the neck very much. Headache. Feeling dizzy. Feeling like you may vomit, or vomiting. Having a hand or arm that: Feels weak. Loses feeling (feels numb). Tingles. You may get symptoms right away after injury, or you  may get them over a few days. In some cases, symptoms may go away with treatment and come back over time. How is this treated? This condition is treated by: Resting your neck. Icing the part of your neck that is hurt. Doing exercises to restore movement and strength to your neck (physical therapy). If there is no swelling, you may use heat therapy 2-3 days after the injury took place. If your injury is very bad, treatment may also include: Keeping your neck in place for a length of time. This may be done using: A neck collar. This supports your chin and the back of your head. A cervical traction device. This is a sling that holds up your head. The sling removes weight and pressure from your neck. It may also help to relieve pain. Medicines that help with: Pain. Irritation and swelling (inflammation). Medicines that help to relax your muscles (muscle relaxants). Surgery. This is rare. Follow these instructions at home: Medicines  Take over-the-counter and prescription medicines only as told by your doctor. Ask your doctor if the medicine prescribed to you: Requires you to avoid driving or using heavy machinery. Can cause trouble pooping (constipation). You may need to take these actions to prevent or treat trouble pooping: Drink enough fluid to keep your pee (urine) pale yellow. Take over-the-counter or prescription medicines. Eat foods that are high in fiber. These include beans, whole grains, and fresh fruits and vegetables. Limit  foods that are high in fat and sugar. These include fried or sweet foods. If you have a neck collar: Wear it as told by your doctor. Do not take it off unless told. Ask your doctor before adjusting your collar. If you have long hair, keep it outside of the collar. Ask your doctor if you may take off the collar for cleaning and bathing. If you may take off the collar: Follow instructions about how to take it off safely. Clean it by hand with mild soap and  water. Let it air-dry fully. If your collar has pads that you can take out: Take the pads out every 1-2 days. Wash them by hand with soap and water. Let the pads air-dry fully before you put them back in the collar. Tell your doctor if your skin under the collar has irritation or sores. Managing pain, stiffness, and swelling   Use a cervical traction device, if told by your doctor. If told, put ice on the affected area. To do this: Put ice in a plastic bag. Place a towel between your skin and the bag. Leave the ice on for 20 minutes, 2-3 times a day. If told, put heat on the affected area. Do this before exercise or as often as told by your doctor. Use the heat source that your doctor recommends, such as a moist heat pack or a heating pad. Place a towel between your skin and the heat source. Leave the heat on for 20-30 minutes. Take the heat off if your skin turns bright red. This is very important if you cannot feel pain, heat, or cold. You may have a greater risk of getting burned. Activity Do not drive while wearing a neck collar. If you do not have a neck collar, ask if it is safe to drive while your neck heals. Do not lift anything that is heavier than 10 lb (4.5 kg), or the limit that you are told, until your doctor tells you that it is safe. Rest as told by your doctor. Do exercises as told by your doctor or physical therapist. Return to your normal activities as told by your doctor. Avoid positions and activities that make you feel worse. Ask your doctor what activities are safe for you. General instructions Do not use any products that contain nicotine or tobacco, such as cigarettes, e-cigarettes, and chewing tobacco. These can delay healing. If you need help quitting, ask your doctor. Keep all follow-up visits as told by your doctor or physical therapist. This is important. How is this prevented? To prevent a neck sprain from happening again: Practice good posture. Adjust your  workstation to help you do this. Exercise regularly as told by your doctor or physical therapist. Avoid activities that are risky or may cause a neck sprain. Contact a doctor if: Your symptoms get worse. Your symptoms do not get better after 2 weeks of treatment. Your pain gets worse. Medicine does not help your pain. You have new symptoms that you cannot explain. Your neck collar gives you sores on your skin or bothers your skin. Get help right away if: You have very bad pain. You get any of the following in any part of your body: Loss of feeling. Tingling. Weakness. You cannot move a part of your body. You have neck pain and either of these: Very bad dizziness. A very bad headache. Summary A cervical sprain is also called a neck sprain. It is a stretch or tear in one or more ligaments in  the neck. Ligaments are tissues that connect bones. Neck sprains may be caused by trauma, such as an injury or a fall. You may get symptoms right away after injury, or you may get them over a few days. Neck sprains may be treated with rest, heat, ice, medicines, exercise, and surgery. This information is not intended to replace advice given to you by your health care provider. Make sure you discuss any questions you have with your health care provider. Document Revised: 03/07/2019 Document Reviewed: 03/07/2019 Elsevier Patient Education  Jeremiah Barnett.

## 2021-08-31 NOTE — Progress Notes (Signed)
° °  Subjective:    Patient ID: Jeremiah Barnett, male    DOB: [birth year], 61 y.o.   MRN: [id number]   Chief Complaint: Neck Pain (Right side radiates to right shoulder area )   Neck Pain  Pertinent negatives include no chest pain, headaches or weakness.  Patient comes inc/o neck pain. Pain is intermittently. Rates pain 7-10/10. Has been increasing the last several weeks. Sitting still helps pain. Nothing seems to aggravate it. Pain radiates over to right shoulder at times. He has not been taking any meds. Only thing he has been doing is resting.    Review of Systems  Constitutional:  Negative for diaphoresis.  Eyes:  Negative for pain.  Respiratory:  Negative for shortness of breath.   Cardiovascular:  Negative for chest pain, palpitations and leg swelling.  Gastrointestinal:  Negative for abdominal pain.  Endocrine: Negative for polydipsia.  Musculoskeletal:  Positive for neck pain.  Skin:  Negative for rash.  Neurological:  Negative for dizziness, weakness and headaches.  Hematological:  Does not bruise/bleed easily.  All other systems reviewed and are negative.     Objective:   Physical Exam Constitutional:      Appearance: Normal appearance. He is obese.  Musculoskeletal:     Comments: Crease ROM of cervical spine with pain on extension, rotation to left and tilting to left Grips equal bil  motor strength and sensation distally intact  Skin:    General: Skin is warm.  Neurological:     Mental Status: He is alert and oriented to person, place, and time.     Cranial Nerves: No cranial nerve deficit.     Sensory: No sensory deficit.  Psychiatric:        Mood and Affect: Mood normal.        Behavior: Behavior normal.   BP (!) 145/84    Pulse 100    Temp 98.2 F (36.8 C)    Resp 20    Ht 5\' 11"  (1.803 m)    Wt 230 lb (104.3 kg)    SpO2 98%    BMI 32.08 kg/m   Cervical xray- abnormality at c2-3- radiology report pending       Assessment & Plan:  Glean Salvo in  today with chief complaint of Neck Pain (Right side radiates to right shoulder area )   1. Neck pain Moist heat Rest radiology pending- will call with report  - DG Cervical Spine Complete; Future  Meds ordered this encounter  Medications   predniSONE (STERAPRED UNI-PAK 21 TAB) 10 MG (21) TBPK tablet    Sig: As directed x 6 days    Dispense:  21 tablet    Refill:  0    Order Specific Question:   Supervising Provider    Answer:   Caryl Pina A [[id number]]     The above assessment and management plan was discussed with the patient. The patient verbalized understanding of and has agreed to the management plan. Patient is aware to call the clinic if symptoms persist or worsen. Patient is aware when to return to the clinic for a follow-up visit. Patient educated on when it is appropriate to go to the emergency department.   Mary-Margaret Hassell Done, FNP

## 2021-09-21 ENCOUNTER — Telehealth: Payer: Self-pay

## 2021-09-21 ENCOUNTER — Encounter: Payer: Self-pay | Admitting: Orthopedic Surgery

## 2021-09-21 ENCOUNTER — Ambulatory Visit (INDEPENDENT_AMBULATORY_CARE_PROVIDER_SITE_OTHER): Payer: Commercial Managed Care - HMO | Admitting: Orthopedic Surgery

## 2021-09-21 DIAGNOSIS — S161XXA Strain of muscle, fascia and tendon at neck level, initial encounter: Secondary | ICD-10-CM | POA: Diagnosis not present

## 2021-09-21 MED ORDER — CYCLOBENZAPRINE HCL 10 MG PO TABS: 10.0000 mg | ORAL_TABLET | Freq: Two times a day (BID) | ORAL | 0 refills | Status: DC | PRN

## 2021-09-21 MED ORDER — DICLOFENAC SODIUM 50 MG PO TBEC: 50.0000 mg | DELAYED_RELEASE_TABLET | Freq: Two times a day (BID) | ORAL | 0 refills | Status: AC | PRN

## 2021-09-21 NOTE — Telephone Encounter (Signed)
-----   Message from Roetta Sessions, [place] sent at 04/08/2021  8:37 AM EDT ----- ?Regarding: due for 2nd Hep A 10-07-21 ?Patient had 1st Hep A on 9-28. Due for 2nd on 3-29  ? ?

## 2021-09-21 NOTE — Telephone Encounter (Signed)
Scheduled patient for Tuesday, Hep A on 3-28 at 9:30am. Called patient but no answer and VM is not set up. ?

## 2021-09-21 NOTE — Progress Notes (Signed)
New Patient Visit ? ?Assessment: ?Jeremiah Barnett is a 61 y.o. male with the following: ?1. Cervical muscle strain, initial encounter ? ?Plan: ?Jeremiah Barnett has chronic right-sided neck pain.  Over the past several months, it is progressively worsening.  He has difficulty sleeping at night.  He has restricted range of motion of his neck to the right.  No numbness or tingling.  Low concern for nerve compression at this time.  He is very reluctant to take medications, but I do think they will help him in this regard.  He had some improvement in his symptoms while taking prednisone.  At this point, I recommended taking some diclofenac as needed, as well as a muscle relaxer, especially to help him sleep at night.  He has also been provided with a home exercise program.  I have recommended that he warm up his neck, either with a heating pad or warm water in the shower, and then complete the exercises.  Follow-up as needed. ? ? ?Follow-up: ?Return if symptoms worsen or fail to improve. ? ?Subjective: ? ?Chief Complaint  ?Patient presents with  ? Pain  ?  Neck pain, right side, post shld/scap pain.  No n/t or radiculopathy.  NKI.  Pain off and on x 3 years.  ? ? ?History of Present Illness: ?Jeremiah Barnett is a 61 y.o. male who has been referred to clinic today by Chevis Pretty, FNP for evaluation of neck pain.  He states he has had pain in the right side of his neck for at least 3 years.  Over the past 6 months, the pain has become more consistent.  It is in the right side of his neck, with some radiating pains into the scapula, as well as the shoulder.  No issues with his shoulder range of motion.  He denies numbness and tingling.  He takes Tylenol occasionally.  He states he does not like to take medications.  Recently, he has been provided with a prednisone Dosepak, but this has not provided sustained relief.  He has difficulty sleeping at night due to the pain.  No physical therapy.  He has not tried muscle  relaxer. ? ? ?Review of Systems: ?No fevers or chills ?No numbness or tingling ?No chest pain ?No shortness of breath ?No bowel or bladder dysfunction ?No GI distress ?No headaches ? ? ?Medical History: ? ?Past Medical History:  ?Diagnosis Date  ? Kidney stone   ? ? ?Past Surgical History:  ?Procedure Laterality Date  ? FOOT FRACTURE SURGERY Right   ? HAND SURGERY Right   ? ? ?Family History  ?Problem Relation Age of Onset  ? Anxiety disorder Mother   ? Ovarian cancer Mother   ? Diabetes Maternal Aunt   ?     x 2  ? Heart disease Maternal Uncle   ? Hernia Maternal Grandmother   ? Diabetes Cousin   ?     "multiple"  ? Stomach cancer Neg Hx   ? Rectal cancer Neg Hx   ? Esophageal cancer Neg Hx   ? Colitis Neg Hx   ? ?Social History  ? ?Tobacco Use  ? Smoking status: Former  ?  Types: Cigarettes  ?  Start date: 07/12/1974  ?  Quit date: 07/12/2010  ?  Years since quitting: 11.2  ? Smokeless tobacco: Never  ?Vaping Use  ? Vaping Use: Never used  ?Substance Use Topics  ? Alcohol use: Yes  ?  Comment: 3-4 per week-socially  ? Drug use: Yes  ?  Types: Marijuana  ?  Comment: last was a week ago, a "couple of hits off a bowl"  ? ? ?Allergies  ?Allergen Reactions  ? Aleve [Naproxen Sodium] Rash  ? Ibuprofen Rash  ? Penicillins Swelling and Rash  ? ? ?Current Meds  ?Medication Sig  ? cyclobenzaprine (FLEXERIL) 10 MG tablet Take 1 tablet (10 mg total) by mouth 2 (two) times daily as needed for muscle spasms.  ? diclofenac (VOLTAREN) 50 MG EC tablet Take 1 tablet (50 mg total) by mouth 2 (two) times daily as needed.  ? famotidine (PEPCID) 10 MG tablet Take 10 mg by mouth daily as needed for heartburn or indigestion.  ? ? ?Objective: ?There were no vitals taken for this visit. ? ?Physical Exam: ? ?General: Alert and oriented. and No acute distress. ?Gait: Normal gait. ? ?Evaluation of the neck demonstrates no deformity.  Tenderness to palpation within the paraspinal muscles on the right side of his neck.  This radiates into the upper  back region.  Restricted rotation of his head to the right.  Bilateral upper body strength is 5/5.  Full and painless range of motion of bilateral shoulders.  Fingers are warm and well-perfused. ? ? ?IMAGING: ?I personally reviewed images previously obtained in clinic ? ?Cervical spine x-rays were obtained in clinic previously.  Mild degenerative changes overall.  No anterolisthesis. ? ? ?New Medications:  ?Meds ordered this encounter  ?Medications  ? cyclobenzaprine (FLEXERIL) 10 MG tablet  ?  Sig: Take 1 tablet (10 mg total) by mouth 2 (two) times daily as needed for muscle spasms.  ?  Dispense:  20 tablet  ?  Refill:  0  ? diclofenac (VOLTAREN) 50 MG EC tablet  ?  Sig: Take 1 tablet (50 mg total) by mouth 2 (two) times daily as needed.  ?  Dispense:  60 tablet  ?  Refill:  0  ? ? ? ? ?Mordecai Rasmussen, MD ? ?09/21/2021 ?7:43 PM ? ? ?

## 2021-09-22 NOTE — Telephone Encounter (Signed)
Called and spoke to patient.  Changed his Nurse visit for Hep A injection to Friday, 3-31 at 10:00am.  He confirmed  ?

## 2021-10-09 ENCOUNTER — Ambulatory Visit (INDEPENDENT_AMBULATORY_CARE_PROVIDER_SITE_OTHER): Payer: Self-pay | Admitting: Gastroenterology

## 2021-10-09 DIAGNOSIS — R7989 Other specified abnormal findings of blood chemistry: Secondary | ICD-10-CM

## 2021-10-09 DIAGNOSIS — R932 Abnormal findings on diagnostic imaging of liver and biliary tract: Secondary | ICD-10-CM

## 2021-10-09 DIAGNOSIS — Z23 Encounter for immunization: Secondary | ICD-10-CM

## 2021-12-22 ENCOUNTER — Other Ambulatory Visit: Payer: Self-pay

## 2021-12-22 MED ORDER — CYCLOBENZAPRINE HCL 10 MG PO TABS: 10.0000 mg | ORAL_TABLET | Freq: Two times a day (BID) | ORAL | 0 refills | Status: AC | PRN

## 2021-12-22 NOTE — Telephone Encounter (Signed)
Flexeril  10 MG  Qty 20 Tablets  Patient uses [facility] in [place]

## 2022-08-02 ENCOUNTER — Ambulatory Visit (INDEPENDENT_AMBULATORY_CARE_PROVIDER_SITE_OTHER): Payer: Managed Care, Other (non HMO) | Admitting: Nurse Practitioner

## 2022-08-02 ENCOUNTER — Encounter: Payer: Self-pay | Admitting: Nurse Practitioner

## 2022-08-02 VITALS — BP 114/70 | HR 109 | Temp 97.8°F | Resp 20 | Ht 71.0 in | Wt 213.0 lb

## 2022-08-02 DIAGNOSIS — R0981 Nasal congestion: Secondary | ICD-10-CM

## 2022-08-02 DIAGNOSIS — R509 Fever, unspecified: Secondary | ICD-10-CM | POA: Diagnosis not present

## 2022-08-02 LAB — VERITOR FLU A/B WAIVED
Influenza A: NEGATIVE
Influenza B: NEGATIVE

## 2022-08-02 LAB — RSV AG, IMMUNOCHR, WAIVED: RSV Ag, Immunochr, Waived: NEGATIVE

## 2022-08-02 MED ORDER — FLUTICASONE PROPIONATE 50 MCG/ACT NA SUSP: 2.0000 | Freq: Every day | NASAL | 6 refills | Status: AC

## 2022-08-02 NOTE — Progress Notes (Signed)
Subjective:    Patient ID: Jeremiah Barnett, male    DOB: 04-08-1961, 62 y.o.   MRN: [id number]   Chief Complaint: influenza  Influenza This is a new problem. Episode onset: friday evening. The problem occurs intermittently. The problem has been gradually worsening. Associated symptoms include congestion, coughing, a fever (?) and myalgias. Pertinent negatives include no sore throat. Nothing aggravates the symptoms. He has tried acetaminophen for the symptoms. The treatment provided mild relief.       Review of Systems  Constitutional:  Positive for fever (?).  HENT:  Positive for congestion. Negative for sore throat.   Respiratory:  Positive for cough.   Musculoskeletal:  Positive for myalgias.       Objective:   Physical Exam Vitals and nursing note reviewed.  Constitutional:      Appearance: Normal appearance. He is well-developed.  HENT:     Head: Normocephalic.     Nose: Congestion and rhinorrhea present.     Mouth/Throat:     Mouth: Mucous membranes are moist.     Pharynx: Oropharynx is clear.  Eyes:     Pupils: Pupils are equal, round, and reactive to light.  Neck:     Thyroid: No thyroid mass or thyromegaly.     Vascular: No carotid bruit or JVD.     Trachea: Phonation normal.  Cardiovascular:     Rate and Rhythm: Normal rate and regular rhythm.  Pulmonary:     Effort: Pulmonary effort is normal. No respiratory distress.     Breath sounds: Normal breath sounds.  Abdominal:     General: Bowel sounds are normal.     Palpations: Abdomen is soft.     Tenderness: There is no abdominal tenderness.  Musculoskeletal:        General: Normal range of motion.     Cervical back: Normal range of motion and neck supple.  Lymphadenopathy:     Cervical: No cervical adenopathy.  Skin:    General: Skin is warm and dry.  Neurological:     Mental Status: He is alert and oriented to person, place, and time.  Psychiatric:        Behavior: Behavior normal.        Thought  Content: Thought content normal.        Judgment: Judgment normal.   BP 114/70   Pulse (!) 109   Temp 97.8 F (36.6 C) (Temporal)   Resp 20   Ht '5\' 11"'$  (1.803 m)   Wt 213 lb (96.6 kg)   SpO2 97%   BMI 29.71 kg/m         Assessment & Plan:  Glean Salvo in today with chief complaint of Generalized Body Aches and Chills   1. Fever, unspecified fever cause - Veritor Flu A/B Waived - Novel Coronavirus, NAA ([facility])  2. Congestion of nasal sinus 1. Take meds as prescribed 2. Use a cool mist humidifier especially during the winter months and when heat has been humid. 3. Use saline nose sprays frequently 4. Saline irrigations of the nose can be very helpful if done frequently.  * 4X daily for 1 week*  * Use of a nettie pot can be helpful with this. Follow directions with this* 5. Drink plenty of fluids 6. Keep thermostat turn down low 7.For any cough or congestion- mucinex if needed 8. For fever or aces or pains- take tylenol or ibuprofen appropriate for age and weight.  * for fevers greater than 101 orally you may alternate  ibuprofen and tylenol every  3 hours.    - RSV Ag, Immunochr, Waived  Meds ordered this encounter  Medications   fluticasone (FLONASE) 50 MCG/ACT nasal spray    Sig: Place 2 sprays into both nostrils daily.    Dispense:  16 g    Refill:  6    Order Specific Question:   Supervising Provider    Answer:   Caryl Pina A [[id number]]     The above assessment and management plan was discussed with the patient. The patient verbalized understanding of and has agreed to the management plan. Patient is aware to call the clinic if symptoms persist or worsen. Patient is aware when to return to the clinic for a follow-up visit. Patient educated on when it is appropriate to go to the emergency department.   Mary-Margaret Hassell Done, FNP

## 2022-08-02 NOTE — Patient Instructions (Signed)
1. Take meds as prescribed 2. Use a cool mist humidifier especially during the winter months and when heat has been humid. 3. Use saline nose sprays frequently 4. Saline irrigations of the nose can be very helpful if done frequently.  * 4X daily for 1 week*  * Use of a nettie pot can be helpful with this. Follow directions with this* 5. Drink plenty of fluids 6. Keep thermostat turn down low 7.For any cough or congestion- mucinex  if needed 8. For fever or aces or pains- take tylenol or ibuprofen appropriate for age and weight.  * for fevers greater than 101 orally you may alternate ibuprofen and tylenol every  3 hours.

## 2022-08-03 LAB — NOVEL CORONAVIRUS, NAA: SARS-CoV-2, NAA: DETECTED — AB

## 2022-09-09 ENCOUNTER — Encounter: Payer: Self-pay | Admitting: Radiology

## 2023-02-19 IMAGING — MR MR ABDOMEN WO/W CM
28 series · 48 of 48 positions shown · IV contrast (10 ML GADAVIST)
Comparison: None.

CLINICAL DATA: Right upper quadrant pain, elevated LFTs

EXAM:
MRI ABDOMEN WITHOUT AND WITH CONTRAST
TECHNIQUE: Multiplanar multisequence MR imaging of the abdomen was performed
both before and after the administration of intravenous contrast.
CONTRAST:  10mL GADAVIST GADOBUTROL 1 MMOL/ML IV SOLN

[Series 3: T2 · coronal · 6.0mm · 1.64mm/px · 1 of 42 slices shown (1 of 2)]
[im 1/42]
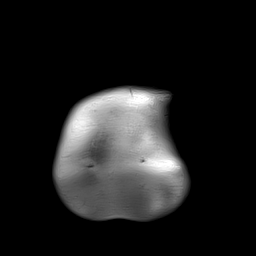

[Series 5: T2 fat-sat · axial · 6.0mm · 1.31mm/px · 1 of 36 slices shown]
[im 1/36]
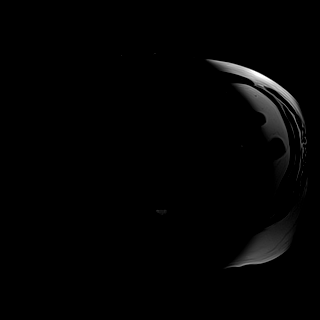

[Series 6: T1 · axial · 3.0mm · 1.31mm/px · 1 of 80 slices shown (1 of 2)]
[im 1/80]
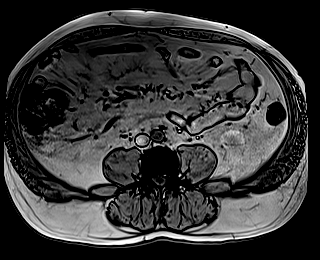

[Series 7: T1 · axial · 3.0mm · 1.31mm/px · 1 of 80 slices shown (2 of 2)]
[im 1/80]
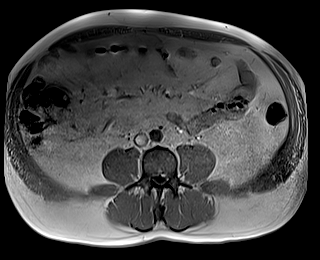

[Series 8: DWI · axial · 6.0mm · 1.57mm/px · 1 of 80 slices shown (1 of 2)]
[im 1/80]
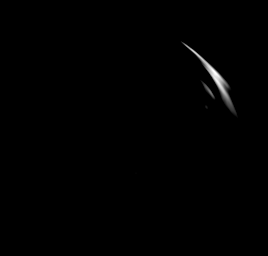

[Series 9: DWI · axial · 6.0mm · 1.57mm/px · 1 of 40 slices shown (2 of 2)]
[im 1/40]
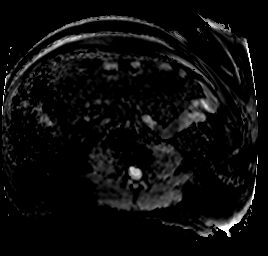

[Series 10: bSSFP · axial · 4.0mm · 0.84mm/px · 1 of 59 slices shown]
[im 1/59]
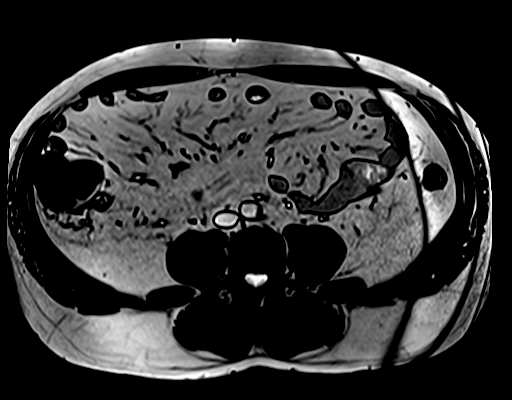

[Series 11: T1 dynamic · axial · 3.0mm · 1.31mm/px · z∈[-115,+146]mm · 2 of 88 slices shown (1 of 20)]
[im 1/88]
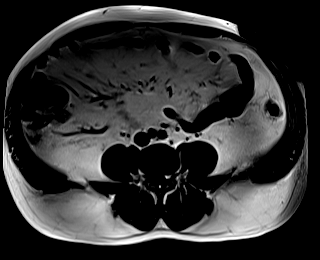
[im 88/88]
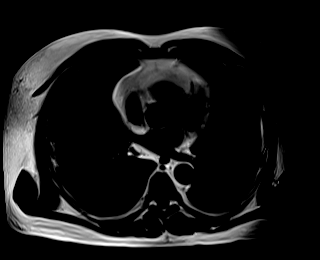

[Series 12: T1 dynamic · axial · 3.0mm · 1.31mm/px · z∈[-115,+146]mm · 2 of 88 slices shown (2 of 20)]
[im 1/88]
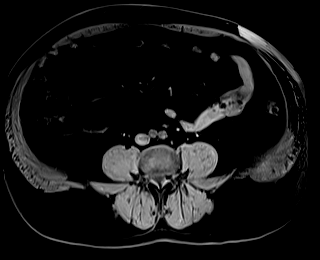
[im 88/88]
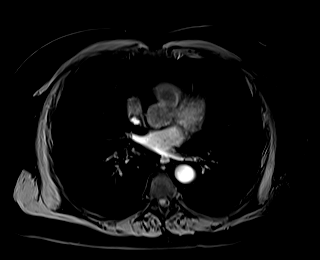

[Series 14: T1 dynamic · axial · 3.0mm · 1.31mm/px · z∈[-115,+146]mm · 2 of 88 slices shown (3 of 20)]
[im 1/88]
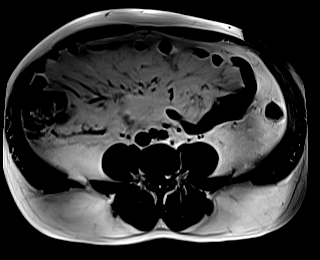
[im 88/88]
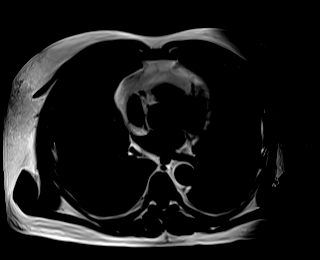

[Series 15: T1 dynamic · axial · 3.0mm · 1.31mm/px · z∈[-115,+146]mm · 2 of 86 slices shown (4 of 20)]
[im 1/86]
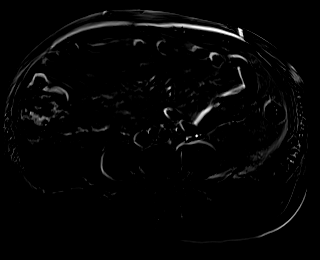
[im 86/86]
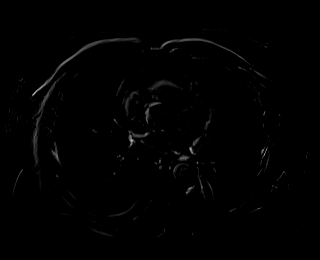

[Series 16: T1 dynamic · axial · 3.0mm · 1.31mm/px · z∈[-115,+146]mm · 2 of 88 slices shown (5 of 20)]
[im 1/88]
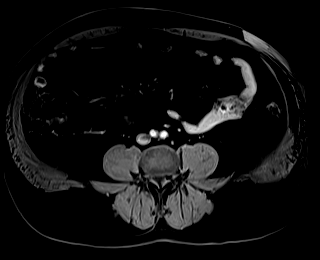
[im 88/88]
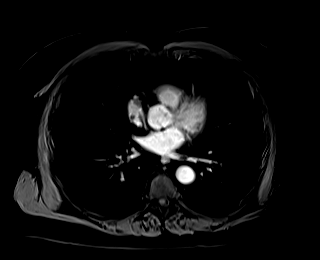

[Series 17: T1 dynamic · axial · 3.0mm · 1.31mm/px · z∈[-115,+146]mm · 2 of 88 slices shown (6 of 20)]
[im 1/88]
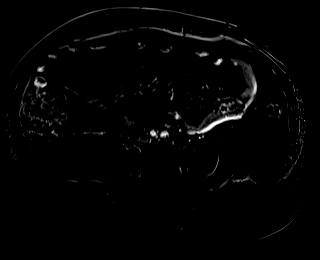
[im 88/88]
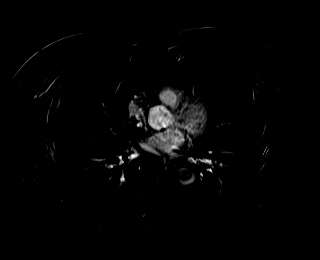

[Series 18: T1 dynamic · axial · 3.0mm · 1.31mm/px · z∈[-115,+146]mm · 2 of 88 slices shown (7 of 20)]
[im 1/88]
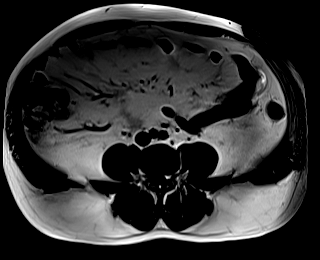
[im 88/88]
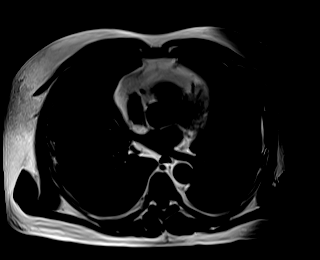

[Series 19: T1 dynamic · axial · 3.0mm · 1.31mm/px · z∈[-115,+146]mm · 2 of 88 slices shown (8 of 20)]
[im 1/88]
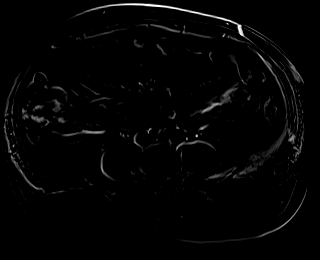
[im 88/88]
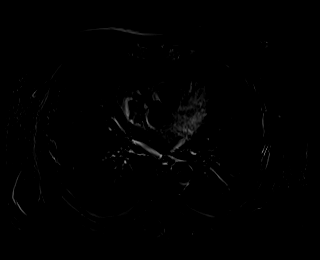

[Series 20: T1 dynamic · axial · 3.0mm · 1.31mm/px · z∈[-115,+146]mm · 2 of 88 slices shown (9 of 20)]
[im 1/88]
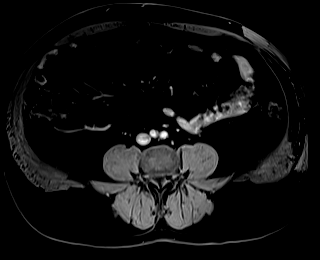
[im 88/88]
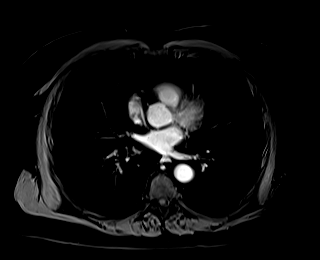

[Series 21: T1 dynamic · axial · 3.0mm · 1.31mm/px · z∈[-115,+146]mm · 2 of 88 slices shown (10 of 20)]
[im 1/88]
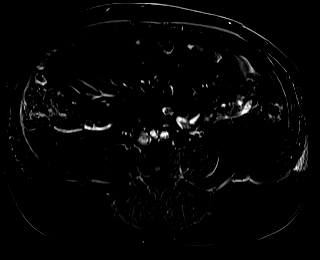
[im 88/88]
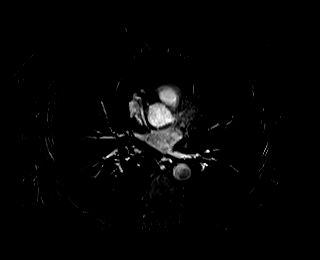

[Series 22: T1 dynamic · axial · 3.0mm · 1.31mm/px · z∈[-115,+146]mm · 2 of 88 slices shown (11 of 20)]
[im 1/88]
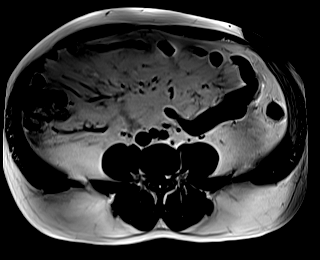
[im 88/88]
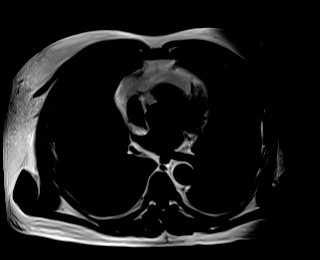

[Series 23: T1 dynamic · axial · 3.0mm · 1.31mm/px · z∈[-115,+146]mm · 2 of 86 slices shown (12 of 20)]
[im 1/86]
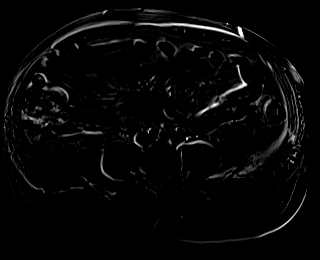
[im 86/86]
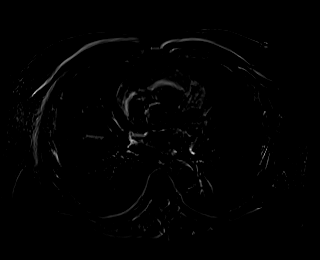

[Series 24: T1 dynamic · axial · 3.0mm · 1.31mm/px · z∈[-115,+146]mm · 2 of 88 slices shown (13 of 20)]
[im 1/88]
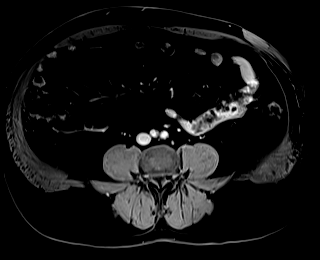
[im 88/88]
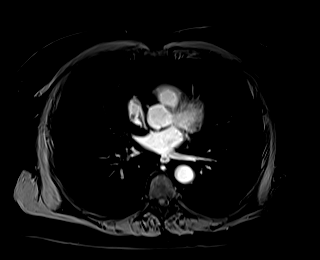

[Series 25: T1 dynamic · axial · 3.0mm · 1.31mm/px · z∈[-115,+146]mm · 2 of 88 slices shown (14 of 20)]
[im 1/88]
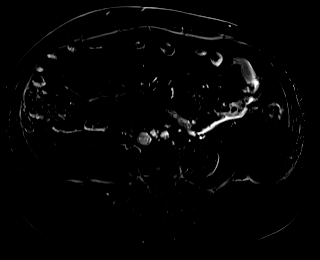
[im 88/88]
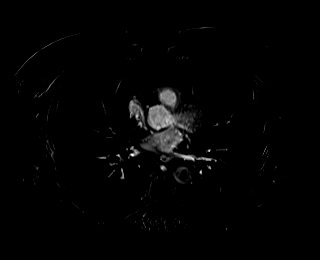

[Series 26: T1 dynamic · coronal · 3.0mm · 1.41mm/px · 2 of 88 slices shown (15 of 20)]
[im 1/88]
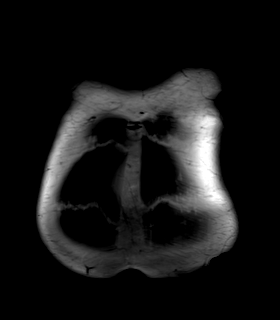
[im 88/88]
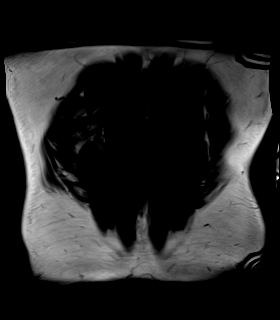

[Series 27: T1 dynamic · coronal · 3.0mm · 1.41mm/px · 2 of 88 slices shown (16 of 20)]
[im 1/88]
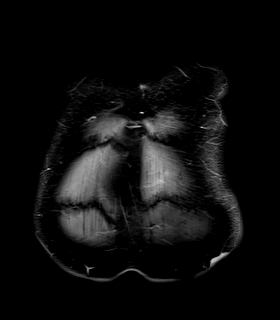
[im 88/88]
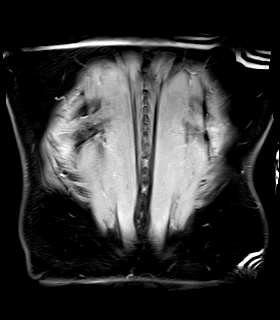

[Series 28: T2 · axial · 6.0mm · 1.64mm/px · 1 of 34 slices shown (2 of 2)]
[im 1/34]
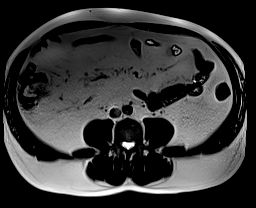

[Series 29: T1 dynamic · axial · 3.0mm · 1.31mm/px · z∈[-115,+146]mm · 2 of 88 slices shown (17 of 20)]
[im 1/88]
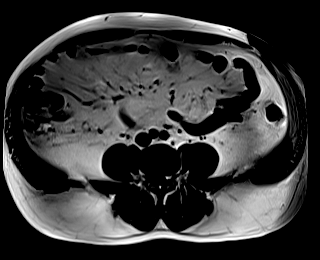
[im 88/88]
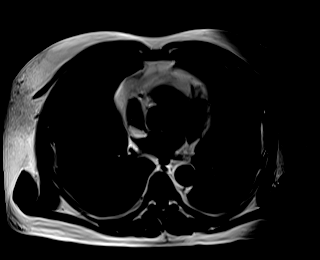

[Series 30: T1 dynamic · axial · 3.0mm · 1.31mm/px · z∈[-115,+146]mm · 2 of 88 slices shown (18 of 20)]
[im 1/88]
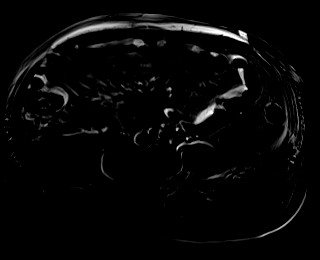
[im 88/88]
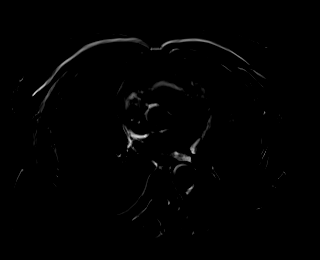

[Series 31: T1 dynamic · axial · 3.0mm · 1.31mm/px · z∈[-115,+146]mm · 2 of 88 slices shown (19 of 20)]
[im 1/88]
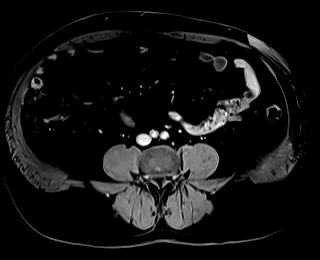
[im 88/88]
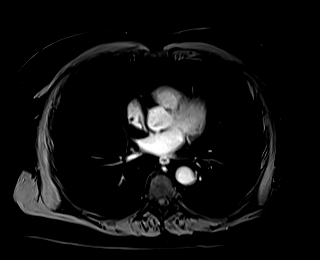

[Series 32: T1 dynamic · axial · 3.0mm · 1.31mm/px · z∈[-115,+146]mm · 2 of 88 slices shown (20 of 20)]
[im 1/88]
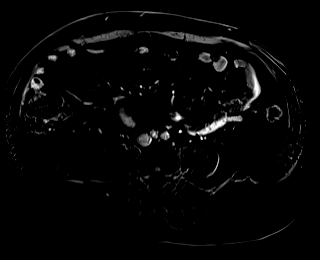
[im 88/88]
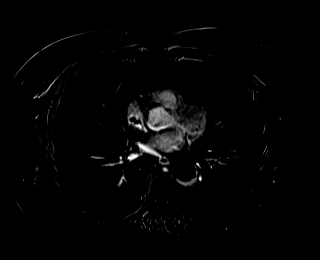

[48 of 48 positions shown; findings below may reference images not displayed]

FINDINGS: Lower chest: No acute findings.

Hepatobiliary: No mass or other parenchymal abnormality identified.
No gallstones. No biliary ductal dilatation.

Pancreas: No mass, inflammatory changes, or other parenchymal
abnormality identified. No pancreatic ductal dilatation.

Spleen:  Within normal limits in size and appearance.

Adrenals/Urinary Tract: No masses identified. No evidence of
hydronephrosis.

Stomach/Bowel: Visualized portions within the abdomen are
unremarkable.

Vascular/Lymphatic: Enlarged portacaval lymph nodes, measuring up to
2.5 x 1.6 cm (series 20, image 45). No abdominal aortic aneurysm
demonstrated.

Other:  None.

Musculoskeletal: No suspicious bone lesions identified.
IMPRESSION: 1. No MR findings to explain right upper quadrant abdominal pain or
elevated LFTs. No biliary ductal dilatation.
2. Enlarged portacaval lymph nodes, nonspecific.
